# Patient Record
Sex: Male | Born: 1935 | Race: White | Hispanic: No | Marital: Married | Smoking: Former smoker
Health system: Southern US, Community
[De-identification: ages and names within clinical notes are randomized; demographics above are authoritative.]

## PROBLEM LIST (undated history)

## (undated) DIAGNOSIS — E8881 Metabolic syndrome: Secondary | ICD-10-CM

## (undated) DIAGNOSIS — I420 Dilated cardiomyopathy: Secondary | ICD-10-CM

## (undated) DIAGNOSIS — I5023 Acute on chronic systolic (congestive) heart failure: Secondary | ICD-10-CM

## (undated) DIAGNOSIS — J439 Emphysema, unspecified: Secondary | ICD-10-CM

## (undated) DIAGNOSIS — I739 Peripheral vascular disease, unspecified: Secondary | ICD-10-CM

## (undated) DIAGNOSIS — I255 Ischemic cardiomyopathy: Secondary | ICD-10-CM

## (undated) DIAGNOSIS — K219 Gastro-esophageal reflux disease without esophagitis: Secondary | ICD-10-CM

## (undated) DIAGNOSIS — I10 Essential (primary) hypertension: Secondary | ICD-10-CM

## (undated) DIAGNOSIS — E7800 Pure hypercholesterolemia, unspecified: Secondary | ICD-10-CM

## (undated) DIAGNOSIS — I219 Acute myocardial infarction, unspecified: Secondary | ICD-10-CM

## (undated) DIAGNOSIS — R0902 Hypoxemia: Secondary | ICD-10-CM

## (undated) DIAGNOSIS — G8929 Other chronic pain: Secondary | ICD-10-CM

## (undated) DIAGNOSIS — M549 Dorsalgia, unspecified: Secondary | ICD-10-CM

## (undated) DIAGNOSIS — I25810 Atherosclerosis of coronary artery bypass graft(s) without angina pectoris: Secondary | ICD-10-CM

## (undated) DIAGNOSIS — Z9189 Other specified personal risk factors, not elsewhere classified: Secondary | ICD-10-CM

## (undated) DIAGNOSIS — I2510 Atherosclerotic heart disease of native coronary artery without angina pectoris: Secondary | ICD-10-CM

## (undated) HISTORY — PX: HERNIA REPAIR: SHX51

## (undated) HISTORY — PX: CORONARY ARTERY BYPASS GRAFT: SHX141

## (undated) HISTORY — PX: CHOLECYSTECTOMY: SHX55

## (undated) HISTORY — PX: BACK SURGERY: SHX140

---

## 1999-10-08 ENCOUNTER — Inpatient Hospital Stay (HOSPITAL_COMMUNITY): Admission: EM | Admit: 1999-10-08 | Discharge: 1999-10-20 | Payer: Self-pay | Admitting: *Deleted

## 1999-10-09 ENCOUNTER — Encounter: Payer: Self-pay | Admitting: *Deleted

## 1999-10-13 ENCOUNTER — Encounter: Payer: Self-pay | Admitting: Cardiothoracic Surgery

## 1999-10-14 ENCOUNTER — Encounter: Payer: Self-pay | Admitting: Cardiothoracic Surgery

## 1999-10-15 ENCOUNTER — Encounter: Payer: Self-pay | Admitting: Cardiothoracic Surgery

## 1999-10-16 ENCOUNTER — Encounter: Payer: Self-pay | Admitting: Cardiothoracic Surgery

## 2001-10-22 ENCOUNTER — Emergency Department (HOSPITAL_COMMUNITY): Admission: EM | Admit: 2001-10-22 | Discharge: 2001-10-22 | Payer: Self-pay | Admitting: Internal Medicine

## 2002-04-30 ENCOUNTER — Emergency Department (HOSPITAL_COMMUNITY): Admission: EM | Admit: 2002-04-30 | Discharge: 2002-04-30 | Payer: Self-pay | Admitting: *Deleted

## 2002-05-03 ENCOUNTER — Inpatient Hospital Stay (HOSPITAL_COMMUNITY): Admission: AD | Admit: 2002-05-03 | Discharge: 2002-05-05 | Payer: Self-pay | Admitting: Urology

## 2002-05-03 ENCOUNTER — Encounter: Payer: Self-pay | Admitting: Urology

## 2002-05-04 ENCOUNTER — Encounter: Payer: Self-pay | Admitting: Urology

## 2002-07-04 ENCOUNTER — Ambulatory Visit (HOSPITAL_COMMUNITY): Admission: RE | Admit: 2002-07-04 | Discharge: 2002-07-04 | Payer: Self-pay | Admitting: Urology

## 2002-07-04 ENCOUNTER — Encounter: Payer: Self-pay | Admitting: Urology

## 2002-07-31 ENCOUNTER — Inpatient Hospital Stay (HOSPITAL_COMMUNITY): Admission: RE | Admit: 2002-07-31 | Discharge: 2002-08-02 | Payer: Self-pay | Admitting: Urology

## 2002-08-03 ENCOUNTER — Encounter (HOSPITAL_COMMUNITY): Admission: RE | Admit: 2002-08-03 | Discharge: 2002-09-02 | Payer: Self-pay | Admitting: Urology

## 2010-05-29 ENCOUNTER — Ambulatory Visit (HOSPITAL_COMMUNITY): Admission: RE | Admit: 2010-05-29 | Discharge: 2010-05-29 | Payer: Self-pay | Admitting: Cardiovascular Disease

## 2010-06-05 ENCOUNTER — Ambulatory Visit (HOSPITAL_COMMUNITY): Admission: RE | Admit: 2010-06-05 | Discharge: 2010-06-05 | Payer: Self-pay | Admitting: Cardiovascular Disease

## 2011-03-01 LAB — BASIC METABOLIC PANEL
BUN: 17 mg/dL (ref 6–23)
CO2: 28 mEq/L (ref 19–32)
Calcium: 9.8 mg/dL (ref 8.4–10.5)
GFR calc non Af Amer: >60 mL/min (ref >60)
Glucose, Bld: 135 mg/dL — ABNORMAL HIGH (ref 70–99)
Potassium: 4.7 mEq/L (ref 3.5–5.1)

## 2011-03-01 LAB — GLUCOSE, CAPILLARY: Glucose-Capillary: 139 mg/dL — ABNORMAL HIGH (ref 70–99)

## 2011-04-28 NOTE — Procedures (Signed)
NAME:  Taylor Goodman, Taylor Goodman              ACCOUNT NO.:  [id number]   MEDICAL RECORD NO.:  [id number]          PATIENT TYPE:  AMB   LOCATION:  SDS                          FACILITY:  [facility]   PHYSICIAN:  Nanetta Batty, M.D.   DATE OF BIRTH:  1932-08-08   DATE OF PROCEDURE:  DATE OF DISCHARGE:                    PERIPHERAL VASCULAR INVASIVE PROCEDURE   PROCEDURE:  Peripheral angiogram.   [name] is a 77 year old Caucasian male father of 5 referred for PV  evaluation.  He has a history of remote bypass grafting in October 2000.  His other problems include remote tobacco abuse, diabetes, hypertension,  hyperlipidemia.  He has severe claudication with Dopplers in our office  which show ABIs in the 0.4 range.  A 2-D echo revealed normal LV  function.  A Myoview showed no ischemia but with scar.  He presents now  for angiography and potential intervention.   DESCRIPTION OF PROCEDURE:  [facility] postabsorptive state.  He was  premedicated with p.o. Valium and some IV Versed and fentanyl.  His  right groin was prepped and shaved in the usual sterile fashion.  Xylocaine 1% was used for local anesthesia.  A 5-French sheath was  inserted into the right femoral artery using standard Seldinger  technique.  A 5-French pigtail catheter was used for abdominal  aortography with bifemoral runoff using a bolus-chase digital  subtraction step-table technique.  Visipaque dye was used for the  entirety of the case.  Retrograde aortic pressure was monitored during  the case.   ANGIOGRAPHIC RESULTS:  1. Abdominal aorta:      a.     Renal arteries - normal.      b.     Infrarenal abdominal aorta - mild atherosclerotic changes.  2. Left lower extremity:      a.     A 50% segmental left external iliac artery stenosis.      b.     Total left common femoral artery.  There was a large       collateral from the external iliac to the  profunda femoris.      c.     Total SFA, popliteal at tibioperoneal trunk.  3. Right lower extremity:      a.     A 50% segmental proximal right external iliac artery       stenosis  with 10-mm pullback gradient.      b.     A total right SFA, popliteal to tibioperoneal trunk with 3-       vessel runoff.   IMPRESSION:  [name] has severe infrainguinal disease not amenable  to revascularization.  Continued medical therapy will be recommended  with Pletal.   The sheath was removed and the pressure was held in the groin to achieve  hemostasis.  The patient left the lab in stable condition.  He will be  hydrated, discharged home in approximately 5 hours, and we will see him  back in the office in 1 week to 2 weeks for followup.      Nanetta Batty, M.D.  JB/MEDQ  D:  06/05/2010  T:  06/05/2010  Job:  [id number]   cc:   Second Floor PV Angiographic Suite  [name], M.D.  [facility]   Electronically Signed by Nanetta Batty M.D. on 06/24/2010 08:46:41 AM

## 2011-05-01 NOTE — Op Note (Signed)
[place]  Patient:    HAZEL WRINKLE                      MRN: [id number] Proc. Date: 10/13/99 Adm. Date:  [id number] Attending:  Mikey Bussing CC:         CVTS office             Veneda Melter, M.D. [facility]                           Operative Report  PREOPERATIVE DIAGNOSES: 1. Class IV post-infarction. 2. Unstable angina with severe three vessel disease. 3. Reduced left ventricular function with ejection fraction of 30%.  POSTOPERATIVE DIAGNOSES: 1. Class IV post-infarction. 2. Unstable angina with severe three vessel disease. 3. Reduced left ventricular function with ejection fraction of 30%.  OPERATION:  Coronary artery bypass grafting x 4 (left internal mammary artery to LAD, saphenous vein graft to first diagonal, saphenous vein graft to OM2, saphenous vein graft to posterior descending).  SURGEON:  Mikey Bussing, M.D.  ASSISTANT:  Areta Haber, P.A.  ANESTHESIOLOGIST:  Bedelia Person, M.D.  ANESTHESIA:  General.  INDICATION:  The patient is a 77 year old white male who developed unstable angina and ruled in for MI after undergoing emergency hip surgery for a fractured left  hip.  He had known coronary artery disease from prior cardiac catheterization by Dr. Arturo Morton. [place].  He has been evaluated for coronary artery bypass grafting in 1998 but felt that it was too high risk.  He had been treated medically and as relatively stable.  He underwent repeat cardiac catheterization by Dr. Veneda Melter after his hip surgery which revealed severe three vessel coronary disease and global dilatation with induced left ventricular function.  He was referred for coronary revascularization.  He did develop rest angina while in the hospital recovering  from his hip surgery.  Prior to the operation, the patient was examined in his hospital room and results of his last cardiac catheterization were discussed with the patient and  his wife. The indications and expected benefits of coronary artery bypass grafting were reviewed with the family and the patient.  I discussed the details of the operation including the placement of the surgical incisions, the choice of conduit, the use of cardiopulmonary bypass and the use of general anesthesia, and the expected postoperative recovery.  I reviewed the potential risks of the operation as well with the patient and wife including the risks of MI, CVA, bleeding, infection, nd death.  He understood the implications for surgery and agreed to proceed with the operation as planned under informed consent.  FINDINGS:  The heart was globally dilated with some inferior wall scarring. The coronaries were small but adequate coronaries for grafting.  The mammary was small but had adequate flow.  The saphenous vein was of good to above average quality.  DESCRIPTION OF PROCEDURE:  The patient was brought to the operating room and placed supine on the operating room table where general anesthesia was induced under invasive hemodynamic monitoring.  The chest, abdomen, and legs were prepped with Betadine and draped as a sterile field.  A median sternotomy was performed.  The saphenous vein was harvested from the right lower extremity.  The left internal mammary artery was harvested as a pedicle graft from its origin at the subclavian vessel and was a good vessel with excellent flow.  Heparin  was administered systemically, and when the ACT reached therapeutic level, the patient was cannulated.  Two purse-strings placed in the ascending aorta and right atrium and placed on cardiopulmonary bypass and cooled to 32 degrees.  The coronaries were dissected and the saphenous vein and the mammary artery were prepared for the distal anastomoses.  A cardioplegia cannula was placed and the  patient was then cooled to 28 degrees.  His aortic cross clamp was applied. Then 750 cc of  cold blood cardioplegia was delivered to the aortic root with immediate cardioplegic arrest and septal temperature dropping less than 12 degrees. Topical ice saline slush was used to augment myocardial preservation and a pericardial insulator pad used to protect the left phrenic nerve.  The distal coronary anastomoses were then performed.  The first distal anastomosis was to the posterior descending.  This was 1.5 mm vessel, proximal 80% stenosis.  A reverse saphenous vein was sewn end-to-side with a running 7-0 Prolene and there was good flow through the graft.  The second distal anastomosis was to the first diagonal.  This was a 1.2 mm vessel, proximal 80% stenosis.  A reverse saphenous vein was sewn end-to-side with a running 7-0 Prolene and there was good flow through the graft.  The third distal anastomosis was to the OM2 on the posterior lateral wall of the left ventricle.  This was a 1.5 mm vessel with proximal 90% stenosis.  A reverse saphenous vein was sewn end-to-side with a running 7-0 Prolene and there was excellent flow through the graft.  Cardioplegia was redosed through the vein grafts.  The fourth distal anastomosis was to the mid-LAD just distal to the second diagonal.  This was a 1.5 mm vessel, proximal 90% stenosis.  The left internal mammary pedicle was brought through an opening created in the left lateral pericardium and was brought down on the LAD and sewn end-to-side with a running 8-0 Prolene.  There was excellent flow through the anastomosis with immediate rise n septal temperature after release of the pedicle clamp on the mammary artery. The mammary pedicle was secured to the epicardium and the aortic cross clamp was removed.  The heart was cardioverted back to a regular rhythm.  A partial occluding clamp was placed on the ascending aorta which had some proximal disease.  The clamp was placed to avoid the plaque.  Three proximal vein anastomoses  were performed using a 4.0 mm flexion running 6-0 Prolene.  The partial occluding clamp was removed and  the vein grafts were perfused.  Each had excellent flow.  The patient was rewarmed and reperfused.  When he reached 37 degrees, and after  temporary pacing wires had been applied, the lungs were expanded, and the patient was weaned from cardiopulmonary bypass without difficulty.  Protamine was administered and the cannulae were removed.  Hemostasis was adequate and the mediastinum was irrigated with warm antibiotic irrigation.  The leg incision was irrigated with antibiotic irrigation and closed in a standard fashion.  Pericardium was approximated superiorly over the aorta and vein grafts. Two mediastinum and a left pleural chest tube were placed and brought out through separate incisions.  The sternum was reapproximated with interrupted steel wire and the pectoralis fascia and subcutaneous layers were closed with running Vicryl. The skin was closed with a subcuticular and sterile dressings were applied.  Total cardiopulmonary bypass time was 130 minutes with aorta cross clamp time of 60 minutes. DD:  10/13/99 TD:  10/13/99 Job: [id number]

## 2011-05-01 NOTE — Discharge Summary (Signed)
NAME:  Taylor Goodman, Taylor Goodman                        ACCOUNT NO.:  [id number]   MEDICAL RECORD NO.:  [id number]                   PATIENT TYPE:  INP   LOCATION:  A206                                 FACILITY:  [facility]   PHYSICIAN:  Dennie Maizes, M.D.                DATE OF BIRTH:  02-13-1932   DATE OF ADMISSION:  07/31/2002  DATE OF DISCHARGE:  08/02/2002                                 DISCHARGE SUMMARY   FINAL DIAGNOSES:  1. Benign prostatic hypertrophy with bladder neck obstruction.  2. Recurrent urinary tract infections.  3. Hematuria.  4. Hypertension.  5. Coronary artery disease.  6. Peripheral vascular disease.   OPERATIVE PROCEDURE:  Transurethral resection of prostate done on July 31, 2002.   COMPLICATIONS:  None.   SUMMARY:  This 77 year old male was evaluated for intermittent gross  hematuria in May 2003.  He was evaluated with a renal ultrasound and CT of  the abdomen and pelvis.  This revealed atrophic left kidney with mild  hydronephrosis.  Cystoscopy was done under anesthesia.  This revealed  hemorrhagic cystitis.  He also had BPH with bladder neck obstruction.  Biopsy of the bladder revealed cystitis.  No evidence of malignancy was  noted.   The patient has been seen on several occasions in the office with recurrent  urinary tract infections.  Pseudomonas aeruginosa had been grown in culture.  The patient has been treated with several courses of antibiotics.  He still  has significant urinary frequency and dysuria associated with voiding  difficulty.  He was brought to the day hospital for cystoscopy and multiple  bladder biopsies and transurethral resection of the prostate.   PAST MEDICAL HISTORY:  Positive for hypertension, coronary artery disease,  peripheral vascular disease, urolithiasis, recurrent urinary tract  infections, status post coronary artery bypass grafting three years ago,  status post left femoral-popliteal bypass surgery, status post surgery  of  both knees, surgery for herniated disk, status post bilateral inguinal  hernia repair, status post open reduction and internal fixation of a left  hip fracture, status post cholecystectomy.   MEDICATIONS:  1. Lipitor 40 mg 1 p.o. q.d.  2. Lorcet Plus 1 p.o. q.h.s. p.r.n. pain.  3. Lopressor 50 mg 1 p.o. b.i.d.  4. Lanoxin 0.125 mg p.o. q.d.  5. Lotensin 10 mg 1 p.o. q.d.  6. Flomax 0.4 mg p.o. q.d.   ALLERGIES:  None.   PHYSICAL EXAMINATION:  HEENT:  Normal.  NECK:  No masses.  LUNGS:  Clear to auscultation.  HEART:  Regular rate and rhythm.  No murmurs.  ABDOMEN:  Soft.  No palpable flank mass.  No costovertebral angle  tenderness.  Bladder not palpable.  GENITALIA:  Penis and testes are normal.  RECTAL:  A 30-g size benign prostate.   LABORATORY DATA:  CBC:  WBC 6.2, hemoglobin 13.7, hematocrit 40.9, BUN 13,  creatinine 1.0.  Urine culture revealed growth of  more than 100,000 colonies  of Pseudomonas aeruginosa.   HOSPITAL COURSE:  The patient was taken to the OR on July 31, 2002.  Under  spinal anesthesia, transurethral resection of the prostate was done.  The  patient did well in the postoperative period.  He was seen by Dr. Felecia Shelling for  management of his medical problems.  On the first postoperative day, he was  found to be stable with clear urine.  The bladder irrigation was  discontinued.  On the second postoperative day, the urine was clear and the  Foley catheter was removed.  The patient was voiding well after the Foley  catheter removal.  He was treated for a urinary tract infection with IV  gentamycin and Rocephin.  The IV Rocephin will be continued on an outpatient  basis for five more days.  The patient was given Percocet 5/325 one p.o.  q.8h. p.r.n. pain #15.  He will be reviewed in the office in two weeks.  The  final pathology of the prostate revealed benign prostatic hypertrophy, as  well as chronic prostatitis.  There was no evidence of malignancy.   The  patient was given postoperative instructions and he was advised to call me  for any fever, chills, voiding difficulty, or gross hematuria.   FOLLOW UP:  He will be reviewed in the office in two weeks for followup.                                                 Dennie Maizes, M.D.    SK/MEDQ  D:  09/21/2002  T:  09/22/2002  Job:  [id number]   cc:   Tesfaye D. Felecia Shelling, M.D.  [address]  [place]  [place] [address]  Fax: [phone]

## 2011-05-01 NOTE — H&P (Signed)
NAME:  Taylor Goodman, Taylor Goodman                        ACCOUNT NO.:  [id number]   MEDICAL RECORD NO.:  [id number]                   PATIENT TYPE:  AMB   LOCATION:  DAY                                  FACILITY:  [facility]   PHYSICIAN:  Dennie Maizes, M.D.                DATE OF BIRTH:  1932-09-14   DATE OF ADMISSION:  07/31/2002  DATE OF DISCHARGE:                                HISTORY & PHYSICAL   CHIEF COMPLAINT:  Recurrent urinary tract infections, urinary frequency,  dysuria.   HISTORY OF PRESENT ILLNESS:  This 77 year old male was evaluated for  intermittent gross hematuria in May 2003.  He was evaluated with a renal  ultrasound and CT of the abdomen and pelvis.  This revealed atrophic left  kidney with mild hydronephrosis.  Cystoscopy was done under anesthesia.  This revealed hemorrhagic cystitis.  He also had BPH with bladder neck  obstruction.  Biopsy of the bladder revealed cystitis.  No evidence of  malignancy was noted.   The patient has been seen on several occasions in the office with recurrent  urinary tract infections.  Pseudomonas aeruginosa has been grown in culture.  The patient has been treated with several courses of antibiotics.  He still  has some significant urinary frequency and dysuria with associated voiding  difficulty.  He is brought to the day hospital today for cystoscopy,  multiple bladder biopsies, and transurethral resection of the prostate.   PAST MEDICAL HISTORY:  Positive for hypertension, coronary artery disease,  peripheral vascular disease, urolithiasis, recurrent urinary tract  infections.  Status post coronary artery bypass grafting three years ago,  status post left femoral-popliteal bypass surgery.  Status post surgery on  both knees.  Surgery for herniated disk.  Status post bilateral inguinal  hernia repair.  Status post open reduction and internal fixation of left hip  fracture.  Status post cholecystectomy.   MEDICATIONS:  Lipitor 40 mg one  p.o. q.d., Lorcet Plus one p.o. q.h.s.  p.r.n. pain, Lopressor 50 mg one p.o. b.i.d., Lanoxin 0.125 mg p.o. q.d.,  Lotensin 10 mg one p.o. q.d., Flomax 0.4 mg p.o. q.d.   ALLERGIES:  None.   PHYSICAL EXAMINATION:  HEENT:  Normal.  NECK:  No masses.  CHEST:  Lungs clear to auscultation.  CARDIAC:  Regular rate and rhythm, no murmurs.  ABDOMEN:  Soft, no palpable flank mass, no costovertebral angle tenderness.  Bladder not palpable.  GENITOURINARY:  Penis and testes are normal.  RECTAL:  A 30 g size benign prostate.   LABORATORY DATA:  The patient has been evaluated with a CT scan of the  abdomen and pelvis.  This revealed normal right kidney without mass or  lesion.  Atrophic left kidney with mild left hydronephrosis is noted.  There  is also bladder wall thickening.   IMPRESSION:  Recurrent urinary tract infections, history of hematuria,  benign prostatic hypertrophy, and bladder neck obstruction.  PLAN:  Cystoscopy, multiple bladder biopsies, transurethral resection of the  prostate under anesthesia in the hospital.  I have discussed with the  patient regarding the diagnosis, operative details, outcome, possible risks  and complications, and he has agreed for the procedure to be done.  He will  be admitted to the hospital in the postoperative period.                                               Dennie Maizes, M.D.    SK/MEDQ  D:  07/30/2002  T:  07/30/2002  Job:  [id number]   cc:   Tesfaye D. Felecia Shelling, M.D.

## 2011-05-01 NOTE — Op Note (Signed)
NAME:  Taylor Goodman, Taylor Goodman                        ACCOUNT NO.:  [id number]   MEDICAL RECORD NO.:  [id number]                   PATIENT TYPE:  INP   LOCATION:  A206                                 FACILITY:  [facility]   PHYSICIAN:  Dennie Maizes, M.D.                DATE OF BIRTH:  06/11/1932   DATE OF PROCEDURE:  07/31/2002  DATE OF DISCHARGE:  08/02/2002                                 OPERATIVE REPORT   PREOPERATIVE DIAGNOSIS:  Benign prostatic hypertrophy with bladder neck  obstruction, recurrent urinary tract infections.   POSTOPERATIVE DIAGNOSIS:  Benign prostatic hypertrophy with bladder neck  obstruction, recurrent urinary tract infections.   PROCEDURE:  Transurethral resection of the prostate.   SURGEON:  Dennie Maizes, M.D.   ANESTHESIA:  Spinal.   COMPLICATIONS:  None.   ESTIMATED BLOOD LOSS:  100 mL   DRAINS:  22 French triple lumen Foley catheter with 30 cc balloon   INDICATIONS FOR PROCEDURE:  This 77 year old male was evaluated for  recurrent urinary tract infections.  He had BPH with bladder neck  obstruction.  He was taken to the OR today for a transurethral resection of  the prostate.   DESCRIPTION OF PROCEDURE:  Spinal anesthesia was induced and the patient was  placed on the OR table in the dorsal lithotomy position.  The lower abdomen  and genitalia were prepped and draped in a sterile fashion.  Cystoscopy was  down with the 20 French cystoscope.  The urethra was normal.  There was  moderate hypertrophy involving both lateral lobes of the prostate with  anatomical obstruction of the bladder neck area.  The bladder was then found  to be trabeculated. There was diffuse hyperkalemia of the bladder mucosa  secondary to the infection.  There was no evidence of any foreign body,  tumor, or stones inside the bladder.  The cystoscope was then removed.   The urethra was dilated up to 30 [place] with [employer] sounds  A 28 [place] resectoscope with  continuous bladder irrigation was then inserted  into the bladder.  Resection was started at 6 o'clock position.  The  obstructing adenoma within the bladder neck and verumontanum were resected  first.  The right and left lateral lobes were then resected up to the level  of the capsule.  The patient had multiple small prostatic calculi near the  capsule which were released.  Finally resection was done in the anterior  midline area.  The prostate chips were removed and sent for  histopathological examination.  The prostatic fossa was then closely  examined and complete hemostasis was  obtained with cauterization.  The instruments were removed.  A 22 French  triple lumen Foley catheter with 30 cc balloon was inserted into the  bladder.  Continuous bladder irrigation was started and the returns were  clear.  The patient was transferred to the PACU in a satisfactory condition.  Dennie Maizes, M.D.    SK/MEDQ  D:  07/31/2002  T:  08/03/2002  Job:  [id number]   cc:   Tesfaye D. Felecia Shelling, M.D.

## 2011-05-01 NOTE — H&P (Signed)
[facility]  Patient:    Taylor Goodman, Taylor Goodman Visit Number: [id number] MRN: [id number]          Service Type: MED Location: 3A [id number] Attending Physician:  Dennie Maizes Dictated by:   Dennie Maizes, M.D. Admit Date:  05/03/2002   CC:         Avon Gully, M.D.   History and Physical  CHIEF COMPLAINT:  Intermittent gross hematuria, passing blood clots in the urine.  HISTORY OF PRESENT ILLNESS:  This 77 year old male came to the office today complaining of intermittent gross hematuria for about three weeks.  He was seen by Dr. Felecia Shelling and treated with a course of Cipro.  The patient continued to have hematuria.  He went to the emergency room.  He was treated with a course of Bactrim.  He has persistent significant gross hematuria at present. He is passing blood clots in the urine.  He has some difficulty with voiding due to the blood clots.  He did not have any fevers, flank pain, or dysuria. He does not have troublesome urinary frequency or nocturia.  He has been treated in the past for chronic prostatitis and BPH.  He also has a history of renal calculi.  PAST MEDICAL HISTORY:  Positive for hypertension, coronary artery disease, peripheral vascular disease, urolithiasis.  PAST SURGICAL HISTORY:  Status post coronary artery bypass grafting three years ago, status post left femoral-popliteal bypass surgery, surgery on both knees, surgery for herniated disk, status post bilateral inguinal hernia repair, status post open reduction and internal fixation of left hip fracture, status post cholecystectomy.  MEDICATIONS: 1. Lipitor 40 mg 1 p.o. q.d. 2. Lorcet Plus 1 p.o. q.8h. p.r.n. pain. 3. Bactrim DS p.o. b.i.d. 4. Lopressor 50 mg 1 p.o. b.i.d. 5. Lanoxin 0.125 mg p.o. q.d. 6. Lotensin 10 mg 1 p.o. q.d. 7. Flomax 0.4 mg p.o. q.d.  ALLERGIES:  None.  PHYSICAL EXAMINATION:  VITAL SIGNS:  Height 5 feet 7 inches, weight 180 pounds.  HEENT:   Normal.  NECK:  No masses.  LUNGS:  Clear to auscultation.  HEART:  Regular rate and rhythm, no murmurs.  ABDOMEN:  Soft.  No palpable flank mass.  No costovertebral angle tenderness.  GU:  Penis and testes are normal.  RECTAL:  Examination showed 30 g size benign prostate.  ADMISSION LABORATORY DATA:  WBC 7.3, hemoglobin 11.3, hematocrit 33.4.  PT 12.2, PTT 25.  BUN 17, creatinine 1.3, electrolytes within normal range. Urinalysis revealed large blood, nitrites positive, leukocytes large.  IMPRESSION: 1. Hematuria. 2. Clot retention. 3. Benign prostatic hypertrophy with bladder neck obstruction. 4. Possible urinary tract infection.  PLAN: 1. Will admit the patient to the hospital. 2. IV fluids. 3. Urine culture and sensitivity.  Will continue the Bactrim. 4. IVP tomograms and renal ultrasound for further evaluation of hematuria. 5. Discussed with the patient regarding further evaluation.  He is    scheduled to undergo cystoscopy, multiple bladder biopsies, transurethral    resection of bladder tumor, possible transurethral resection of prostate    under anesthesia in the a.m.  I have explained to him regarding diagnosis,    operative details, outcome, possible risks and complications, and he has    agreed for the procedure to be done. Dictated by:   Dennie Maizes, M.D. Attending Physician:  Dennie Maizes DD:  05/03/02 TD:  05/03/02 Job: [id number]

## 2011-05-01 NOTE — Op Note (Signed)
[facility]  Patient:    Taylor Goodman, Taylor Goodman Visit Number: [id number] MRN: [id number]          Service Type: MED Location: 3A N027 01 Attending Physician:  Dennie Maizes Dictated by:   Dennie Maizes, M.D. Proc. Date: 05/04/02 Admit Date:  05/03/2002 Discharge Date: 05/05/2002   CC:         Avon Gully, M.D.   Operative Report  PREOPERATIVE DIAGNOSES:  Hematuria, clot retention, possible bladder tumor.  POSTOPERATIVE DIAGNOSES:  Hematuria, clot retention, hemorrhagic cystitis, bleeding from prostate.  ANESTHESIA:  Spinal.  SURGEON:  Dennie Maizes, M.D.  PROCEDURE:  Cystoscopy, evacuation of blood clots, multiple bladder biopsies and fulguration of the prostate.  COMPLICATIONS:  None.  ESTIMATED BLOOD LOSS:  Minimal.  DRAINS:  An 5 French Foley catheter in the bladder.  INDICATIONS FOR PROCEDURE:  This 77 year old male was admitted to the hospital with intermittent gross hematuria for about three weeks. He was evaluated with an IVP tomogram/renal ultrasound. Both these x-rays revealed atrophic left kidney with moderate left hydronephrosis and dilated ureter up to the distal ureter. The right kidney and collecting system were normal. Large intraluminal bladder masses were noted probably representing bladder tumor or blood clots. The patient was taken to the OR today for cystoscopy, multiple bladder biopsies, possible TURP. I plan to evaluate the left collecting system with retrograde pyelogram.  DESCRIPTION OF PROCEDURE:  Spinal anesthesia was induced and the patient was placed on the OR table in the dorsal lithotomy position. The lower abdomen and genitalia were prepped and draped in a sterile fashion. Cystoscopy was done with a 25 [place] scope. The patient had a large amount of blood stained urine in the bladder. Large blood clots were noted inside the bladder. I tried to remove the blood clots with [place] syringe irrigation, the  blood clots were too large to be removed through the cystoscope. The cystoscope was removed.  The urethra was dilated up to 30 [place] with [employer]. A 28 French resectoscope with continuous bladder irrigation was then inserted into the bladder. With an [facility] evacuator, irrigation of the bladder was done and all the blood clots were removed.  The cystoscope was then reintroduced and the bladder was examined. The bladder was found to be heavily trabeculated with hyperemia of the bladder mucosa. This was suggestive of hemorrhagic cystitis. There was no evidence of any bladder tumor or foreign body in the bladder. The ureteral orifices could not be seen clearly and hence a left retrograde pyelogram could not be done. The radiology report suggested a stone inside the bladder diverticulum. I was unable to see any diverticulum especially on the right side. This needs further evaluation with a CT scan.  With the rigid biopsy forceps, mucosal biopsies are done from the right lateral wall, posterior wall and left lateral wall of the bladder. An electrode was introduced into the bladder and the biopsy sites were fulgurated. Examination of the prostate revealed moderate hypertrophy of both lateral lobes of the prostate with partial obstruction of the bladder neck area. There was bleeding from the prostatic mucosa at the 6 oclock position. This was fulgurated and the bleeding was controlled. There was no active bleeding at this time. The instruments are removed. An 29 French Foley catheter was inserted into the bladder and connected to the bag. The patient was transferred to the PACU in a satisfactory condition.  The cystoscopy today is unsatisfactory due to the blood clots and cloudy urine. I plan to  reevaluate the patient in a few weeks with repeat cystoscopy. He may need TURP or holmium laser oblation of the prostate at the time. Will try to do a retrograde pyelogram at that time to  evaluate the left kidney and collecting system. Dictated by:   Dennie Maizes, M.D. Attending Physician:  Dennie Maizes DD:  05/04/02 TD:  05/08/02 Job: [id number]

## 2011-05-01 NOTE — Discharge Summary (Signed)
NAME:  Taylor Goodman, Taylor Goodman                        ACCOUNT NO.:  [id number]   MEDICAL RECORD NO.:  [id number]                   PATIENT TYPE:  OUT   LOCATION:  RAD                                  FACILITY:  [facility]   PHYSICIAN:  Dennie Maizes, M.D.                DATE OF BIRTH:  [birth year]   DATE OF ADMISSION:  05/03/2002  DATE OF DISCHARGE:  05/05/2002                                 DISCHARGE SUMMARY   FINAL DIAGNOSES:  1. Benign prostatic hypertrophy with bladder neck obstruction.  2. Hematuria.  3. Hypertension.  4. Hemorrhagic cystitis.  5. Atrophic left kidney with hydronephrosis.  6. Coronary artery disease.  7. Peripheral vascular disease.  8. Urolithiasis.   PROCEDURES:  1. Cystoscopy.  2. Evacuation of blood clots.  3. Multiple bladder biopsies  and fulguration of the prostate done on     05/04/2002, complications none.   HISTORY:  This 77 year old male came to the office complaining of  intermittent gross hematuria for about three weeks.  He was seen by Dr.  Felecia Shelling and started on a course of  Cipro.  The patient continued to have  hematuria.  He went to the emergency room.  He was started on a course of  Bactrim.  He had persistent significant gross hematuria, and he was passing  blood clots in the urine.  He also had some difficulty with voiding due to  the blood clots. He denied having any fever, flank pain, or dysuria.  He  denied having any troublesome urinary frequency or nocturia.  He had been  treated in the past for chronic prostatitis and BPH.  He also has renal  calculi.   PAST MEDICAL HISTORY:  Positive for hypertension, coronary artery disease,  peripheral vascular disease, and urolithiasis.   PAST SURGICAL HISTORY:  Status post coronary artery bypass grafting about  three years ago.  He is status post left femoral-popliteal bypass surgery,  surgery on both knees, surgery for herniated disk, status post bilateral  inguinal hernia repair, status post open  reduction, internal fixation of  left limb fracture,and status post cholecystectomy.   MEDICATIONS:  1. Lipitor 40 mg 1 p.o. q.d.  2. Lorcet Plus 1 p.o. q.8h. p.r.n. pain.  3. Bactrim p.o. b.i.d.  4. Lopressor 50 mg p.o. b.i.d.  5. Lanoxin 0.125 mg p.o. q.d.  6. Lotensin 10 mg 1 p.o. q.d.  7. Flomax 0.4 mg 1 p.o. q.d.   ALLERGIES:  None.   PHYSICAL EXAMINATION:  VITAL SIGNS:  Height 5 feet 7 inches, weight 180  pounds.  HEENT:  Normal.  NECK:  No masses.  LUNGS:  Clear to auscultation.  HEART:  Regular rate and rhythm, no murmur.  ABDOMEN:  Soft, no palpable flank mass, no costovertebral angle tenderness.  GU:  Penis and testes are normal.  RECTAL:  A 30 mg size benign prostate.   ADMISSION LABORATORY DATA:  WBC 7.3, hemoglobin  11.3, hematocrit 33.4.  PT  12.2, PTT 25.  BUN 17, creatinine 1.3.  Electrolytes were within normal  range.  Urinalysis revealed large blood, nitrite positive, leukocyte  esterase large.   COURSE IN THE HOSPITAL:  The patient was admitted to the hospital and  started on IV fluids.  Urine culture and sensitivity was done, and Bactrim  was continued.  IVP tomograms and renal ultrasound were done. These revealed  atrophic left kidney with dilated collecting system up to the ureteropelvic  and uterosacral junction.  There was no evidence of any filling defect or  stones in the collecting system on the left side.   After counseling, the patient was taken to the OR for cystoscopy and  multiple bladder biopsies with possible TURBT after transurethral resection  of the prostate.  On 05/04/2002, cystoscopy and evacuation of blood clots  were done.  The patient had hemorrhagic cystitis and bleeding from the  prostate.  After multiple bladder biopsies, fulguration of the prostate was  done.  The patient did well in the postoperative period.  He was seen by Dr.  Felecia Shelling for evaluation and management of his medical problems.   On the first postoperative day, the  patient was found to be doing well  without any complaints.  His abdomen was soft, and he was voiding well.  His  hemoglobin was 9.4, and hematocrit was 27.3.  I discussed with the patient  regarding transfusion, and he declined transfusion.  He was started on oral  iron supplements.   The patient was discharged and sent home on 07/05/2002.  He was continued on  Bactrim p.o. b.i.d. and was started on Feosol 1 p.o. q.d. for 2 months for  correction of anemia.  He was advised to call me for any fever, chills,  severe pain or bleeding.  He will return to the office for followup in one  week.   The bladder biopsy revealed a chronic active cystitis and follicular  prostatitis. There was no evidence of malignancy.                                                Dennie Maizes, M.D.    SK/MEDQ  D:  07/06/2002  T:  07/15/2002  Job:  [id number]   cc:   Tesfaye D. Felecia Shelling, M.D.
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
## 2011-10-15 ENCOUNTER — Telehealth: Payer: Self-pay

## 2011-10-15 NOTE — Telephone Encounter (Signed)
Called and spoke to pt's wife. She said they always see Dr. Karilyn Cota. I faxed the referral note back to [place] at [facility] to forward to Dr. Karilyn Cota.

## 2012-01-14 ENCOUNTER — Encounter (INDEPENDENT_AMBULATORY_CARE_PROVIDER_SITE_OTHER): Payer: Self-pay | Admitting: *Deleted

## 2012-01-19 ENCOUNTER — Telehealth (INDEPENDENT_AMBULATORY_CARE_PROVIDER_SITE_OTHER): Payer: Self-pay | Admitting: *Deleted

## 2012-01-19 NOTE — Telephone Encounter (Signed)
Taylor Goodman called and would like to see if Dr. Karilyn Cota would please seen her husband. Taylor Goodman is a long time patient of Dr. Patty Sermons. Taylor Goodman, her husband, was referred to [facility] for a tcs.

## 2012-01-21 NOTE — Telephone Encounter (Signed)
Left message on answering machine asking [name] to call so I can get her husband's info and sch'd TCS

## 2012-02-03 ENCOUNTER — Other Ambulatory Visit (INDEPENDENT_AMBULATORY_CARE_PROVIDER_SITE_OTHER): Payer: Self-pay | Admitting: *Deleted

## 2012-02-03 ENCOUNTER — Telehealth (INDEPENDENT_AMBULATORY_CARE_PROVIDER_SITE_OTHER): Payer: Self-pay | Admitting: *Deleted

## 2012-02-03 DIAGNOSIS — Z800 Family history of malignant neoplasm of digestive organs: Secondary | ICD-10-CM

## 2012-02-03 DIAGNOSIS — Z1211 Encounter for screening for malignant neoplasm of colon: Secondary | ICD-10-CM

## 2012-02-03 MED ORDER — PEG-KCL-NACL-NASULF-NA ASC-C 100 G PO SOLR: 1.0000 | Freq: Once | ORAL | Status: DC

## 2012-02-03 NOTE — Telephone Encounter (Signed)
Patient needs movi prep 

## 2012-03-01 ENCOUNTER — Telehealth (INDEPENDENT_AMBULATORY_CARE_PROVIDER_SITE_OTHER): Payer: Self-pay | Admitting: *Deleted

## 2012-03-01 NOTE — Telephone Encounter (Signed)
Requesting MD/PCP:  [facility]     Name & DOB: Taylor Goodman 10/17/1932     Procedure: tcs  Reason/Indication:  Screening, fam hx colon ca  Has patient had this procedure before?  no  If so, when, by whom and where?    Is there a family history of colon cancer?  yes  Who?  What age when diagnosed?  brother  Is patient diabetic?   no      Does patient have prosthetic heart valve?  no  Do you have a pacemaker?  no  Has patient had joint replacement within last 12 months?  no  Is patient on Coumadin, Plavix and/or Aspirin? yes  Medications: asa 81 mg daily, omeprazole 40 mg daily, lisinopril/HCTZ 10/12.5 mg daily, simvastatin 40 mg daily, hydrocodone 10/650 mg daily, [place]  Allergies: nkda   Pharmacy: [facility]  Medication Adjustment: asa 2 days  Procedure date & time: 03/31/12 at 1030

## 2012-03-03 NOTE — Telephone Encounter (Signed)
agree

## 2012-03-30 ENCOUNTER — Encounter (HOSPITAL_COMMUNITY): Payer: Self-pay | Admitting: Pharmacy Technician

## 2012-03-30 MED ORDER — SODIUM CHLORIDE 0.45 % IV SOLN
Freq: Once | INTRAVENOUS | Status: AC
  Administered 2012-03-31: 10:00:00 via INTRAVENOUS

## 2012-03-31 ENCOUNTER — Encounter (HOSPITAL_COMMUNITY): Payer: Self-pay | Admitting: *Deleted

## 2012-03-31 ENCOUNTER — Encounter (HOSPITAL_COMMUNITY)
Admission: RE | Disposition: A | Discharge status: Home Health | Payer: Self-pay | Source: Ambulatory Visit | Attending: Internal Medicine

## 2012-03-31 ENCOUNTER — Ambulatory Visit (HOSPITAL_COMMUNITY)
Admission: RE | Admit: 2012-03-31 | Discharge: 2012-03-31 | Disposition: A | Discharge status: Home Health | Payer: Medicare Other | Source: Ambulatory Visit | Attending: Internal Medicine | Admitting: Internal Medicine

## 2012-03-31 DIAGNOSIS — Z79899 Other long term (current) drug therapy: Secondary | ICD-10-CM | POA: Insufficient documentation

## 2012-03-31 DIAGNOSIS — Z800 Family history of malignant neoplasm of digestive organs: Secondary | ICD-10-CM

## 2012-03-31 DIAGNOSIS — D126 Benign neoplasm of colon, unspecified: Secondary | ICD-10-CM | POA: Insufficient documentation

## 2012-03-31 DIAGNOSIS — Z1211 Encounter for screening for malignant neoplasm of colon: Secondary | ICD-10-CM

## 2012-03-31 DIAGNOSIS — K644 Residual hemorrhoidal skin tags: Secondary | ICD-10-CM | POA: Insufficient documentation

## 2012-03-31 DIAGNOSIS — K5730 Diverticulosis of large intestine without perforation or abscess without bleeding: Secondary | ICD-10-CM | POA: Insufficient documentation

## 2012-03-31 DIAGNOSIS — I10 Essential (primary) hypertension: Secondary | ICD-10-CM | POA: Insufficient documentation

## 2012-03-31 DIAGNOSIS — E7800 Pure hypercholesterolemia, unspecified: Secondary | ICD-10-CM | POA: Insufficient documentation

## 2012-03-31 HISTORY — DX: Pure hypercholesterolemia, unspecified: E78.00

## 2012-03-31 HISTORY — DX: Essential (primary) hypertension: I10

## 2012-03-31 HISTORY — DX: Dorsalgia, unspecified: M54.9

## 2012-03-31 HISTORY — DX: Atherosclerotic heart disease of native coronary artery without angina pectoris: I25.10

## 2012-03-31 HISTORY — PX: COLONOSCOPY: SHX5424

## 2012-03-31 HISTORY — DX: Peripheral vascular disease, unspecified: I73.9

## 2012-03-31 HISTORY — DX: Acute myocardial infarction, unspecified: I21.9

## 2012-03-31 HISTORY — DX: Other chronic pain: G89.29

## 2012-03-31 HISTORY — DX: Gastro-esophageal reflux disease without esophagitis: K21.9

## 2012-03-31 SURGERY — COLONOSCOPY
Anesthesia: Moderate Sedation

## 2012-03-31 MED ORDER — MEPERIDINE HCL 50 MG/ML IJ SOLN
INTRAMUSCULAR | Status: AC
  Filled 2012-03-31: qty 1

## 2012-03-31 MED ORDER — MEPERIDINE HCL 50 MG/ML IJ SOLN
INTRAMUSCULAR | Status: DC | PRN
  Administered 2012-03-31 (×2): 25 mg via INTRAVENOUS

## 2012-03-31 MED ORDER — MIDAZOLAM HCL 5 MG/5ML IJ SOLN
INTRAMUSCULAR | Status: AC
  Filled 2012-03-31: qty 10

## 2012-03-31 MED ORDER — MIDAZOLAM HCL 5 MG/5ML IJ SOLN
INTRAMUSCULAR | Status: DC | PRN
  Administered 2012-03-31 (×2): 2 mg via INTRAVENOUS

## 2012-03-31 MED ORDER — SIMETHICONE 40 MG/0.6ML PO SUSP
ORAL | Status: DC | PRN
  Administered 2012-03-31: 11:00:00

## 2012-03-31 NOTE — Discharge Instructions (Signed)
No aspirin for one week. Resume other medications as before. High fiber diet. No driving for 24 hours. Physician will contact you with the biopsy results.  Colonoscopy Care After Read the instructions outlined below and refer to this sheet in the next few weeks. These discharge instructions provide you with general information on caring for yourself after you leave the hospital. Your doctor may also give you specific instructions. While your treatment has been planned according to the most current medical practices available, unavoidable complications occasionally occur. If you have any problems or questions after discharge, call your doctor. HOME CARE INSTRUCTIONS ACTIVITY:  You may resume your regular activity, but move at a slower pace for the next 24 hours.   Take frequent rest periods for the next 24 hours.   Walking will help get rid of the air and reduce the bloated feeling in your belly (abdomen).   No driving for 24 hours (because of the medicine (anesthesia) used during the test).   You may shower.   Do not sign any important legal documents or operate any machinery for 24 hours (because of the anesthesia used during the test).  NUTRITION:  Drink plenty of fluids.   You may resume your normal diet as instructed by your doctor.   Begin with a light meal and progress to your normal diet. Heavy or fried foods are harder to digest and may make you feel sick to your stomach (nauseated).   Avoid alcoholic beverages for 24 hours or as instructed.  MEDICATIONS:  You may resume your normal medications unless your doctor tells you otherwise.  WHAT TO EXPECT TODAY:  Some feelings of bloating in the abdomen.   Passage of more gas than usual.   Spotting of blood in your stool or on the toilet paper.  IF YOU HAD POLYPS REMOVED DURING THE COLONOSCOPY:  No aspirin products for 7 days or as instructed.   No alcohol for 7 days or as instructed.   Eat a soft diet for the next  24 hours.  FINDING OUT THE RESULTS OF YOUR TEST Not all test results are available during your visit. If your test results are not back during the visit, make an appointment with your caregiver to find out the results. Do not assume everything is normal if you have not heard from your caregiver or the medical facility. It is important for you to follow up on all of your test results.  SEEK IMMEDIATE MEDICAL CARE IF:  You have more than a spotting of blood in your stool.   Your belly is swollen (abdominal distention).   You are nauseated or vomiting.   You have a fever.   You have abdominal pain or discomfort that is severe or gets worse throughout the day.  Document Released: 07/14/2004 Document Revised: 11/19/2011 Document Reviewed: 07/12/2008 [facility] Patient Information 2012 [place], [place].  Colon Polyps A polyp is extra tissue that grows inside your body. Colon polyps grow in the large intestine. The large intestine, also called the colon, is part of your digestive system. It is a long, hollow tube at the end of your digestive tract where your body makes and stores stool. Most polyps are not dangerous. They are benign. This means they are not cancerous. But over time, some types of polyps can turn into cancer. Polyps that are smaller than a pea are usually not harmful. But larger polyps could someday become or may already be cancerous. To be safe, doctors remove all polyps and test  them.  WHO GETS POLYPS? Anyone can get polyps, but certain people are more likely than others. You may have a greater chance of getting polyps if:  You are over 50.   You have had polyps before.   Someone in your family has had polyps.   Someone in your family has had cancer of the large intestine.   Find out if someone in your family has had polyps. You may also be more likely to get polyps if you:   Eat a lot of fatty foods.   Smoke.   Drink alcohol.   Do not exercise.   Eat too much.    SYMPTOMS  Most small polyps do not cause symptoms. People often do not know they have one until their caregiver finds it during a regular checkup or while testing them for something else. Some people do have symptoms like these:  Bleeding from the anus. You might notice blood on your underwear or on toilet paper after you have had a bowel movement.   Constipation or diarrhea that lasts more than a week.   Blood in the stool. Blood can make stool look black or it can show up as red streaks in the stool.  If you have any of these symptoms, see your caregiver. HOW DOES THE DOCTOR TEST FOR POLYPS? The doctor can use four tests to check for polyps:  Digital rectal exam. The caregiver wears gloves and checks your rectum (the last part of the large intestine) to see if it feels normal. This test would find polyps only in the rectum. Your caregiver may need to do one of the other tests listed below to find polyps higher up in the intestine.   Barium enema. The caregiver puts a liquid called barium into your rectum before taking x-rays of your large intestine. Barium makes your intestine look white in the pictures. Polyps are dark, so they are easy to see.   Sigmoidoscopy. With this test, the caregiver can see inside your large intestine. A thin flexible tube is placed into your rectum. The device is called a sigmoidoscope, which has a light and a tiny video camera in it. The caregiver uses the sigmoidoscope to look at the last third of your large intestine.   Colonoscopy. This test is like sigmoidoscopy, but the caregiver looks at all of the large intestine. It usually requires sedation. This is the most common method for finding and removing polyps.  TREATMENT   The caregiver will remove the polyp during sigmoidoscopy or colonoscopy. The polyp is then tested for cancer.   If you have had polyps, your caregiver may want you to get tested regularly in the future.  PREVENTION  There is not one sure  way to prevent polyps. You might be able to lower your risk of getting them if you:  Eat more fruits and vegetables and less fatty food.   Do not smoke.   Avoid alcohol.   Exercise every day.   Lose weight if you are overweight.   Eating more calcium and folate can also lower your risk of getting polyps. Some foods that are rich in calcium are milk, cheese, and broccoli. Some foods that are rich in folate are chickpeas, kidney beans, and spinach.   Aspirin might help prevent polyps. Studies are under way.  Document Released: 08/26/2004 Document Revised: 11/19/2011 Document Reviewed: 02/01/2008 [facility] Patient Information 2012 [place], [place].  Diverticulosis Diverticulosis is a common condition that develops when small pouches (diverticula) form in the  wall of the colon. The risk of diverticulosis increases with age. It happens more often in people who eat a low-fiber diet. Most individuals with diverticulosis have no symptoms. Those individuals with symptoms usually experience abdominal pain, constipation, or loose stools (diarrhea). HOME CARE INSTRUCTIONS   Increase the amount of fiber in your diet as directed by your caregiver or dietician. This may reduce symptoms of diverticulosis.   Your caregiver may recommend taking a dietary fiber supplement.   Drink at least 6 to 8 glasses of water each day to prevent constipation.   Try not to strain when you have a bowel movement.   Your caregiver may recommend avoiding nuts and seeds to prevent complications, although this is still an uncertain benefit.   Only take over-the-counter or prescription medicines for pain, discomfort, or fever as directed by your caregiver.  FOODS WITH HIGH FIBER CONTENT INCLUDE:  Fruits. Apple, peach, pear, tangerine, raisins, prunes.   Vegetables. Brussels sprouts, asparagus, broccoli, cabbage, carrot, cauliflower, romaine lettuce, spinach, summer squash, tomato, winter squash, zucchini.   Starchy  Vegetables. Baked beans, kidney beans, lima beans, split peas, lentils, potatoes (with skin).   Grains. Whole wheat bread, brown rice, bran flake cereal, plain oatmeal, white rice, shredded wheat, bran muffins.  SEEK IMMEDIATE MEDICAL CARE IF:   You develop increasing pain or severe bloating.   You have an oral temperature above 102 F (38.9 C), not controlled by medicine.   You develop vomiting or bowel movements that are bloody or black.  Document Released: 08/27/2004 Document Revised: 11/19/2011 Document Reviewed: 04/30/2010 [facility] Patient Information 2012 [place], [place].

## 2012-03-31 NOTE — H&P (Signed)
Taylor Goodman is an 78 y.o. male.   Chief Complaint: Patient is in for colonoscopy. HPI: Patient is a 78 year old Caucasian male was in screening colonoscopy. This is patient's first exam. He denies abdominal pain change in his bowel habits or rectal bleeding. He does complain of intermittent self-limiting nausea. He was given PPI by his primary care physician it helped a little bit. He has never experienced vomiting. He denies weight loss. Family history significant for colon carcinoma and a sister who was diagnosed in her 62s and died in her 70s.  Past Medical History  Diagnosis Date  . Hypertension   . Hypercholesteremia   . Coronary artery disease   . Myocardial infarction   . Chronic back pain   . Peripheral vascular disease   . GERD (gastroesophageal reflux disease)     Past Surgical History  Procedure Date  . Coronary artery bypass graft   . Cholecystectomy   . Hernia repair     X 3  . Back surgery     Family History  Problem Relation Age of Onset  . Colon cancer Sister    Social History:  reports that he has quit smoking. His smoking use included Cigarettes. He has a 15 pack-year smoking history. He does not have any smokeless tobacco history on file. He reports that he does not drink alcohol or use illicit drugs.  Allergies: No Known Allergies  Medications Prior to Admission  Medication Dose Route Frequency Provider Last Rate Last Dose  . 0.45 % sodium chloride infusion   Intravenous Once Malissa Hippo, MD 20 mL/hr at 03/31/12 0959    . meperidine (DEMEROL) 50 MG/ML injection           . midazolam (VERSED) 5 MG/5ML injection            Medications Prior to Admission  Medication Sig Dispense Refill  . aspirin 81 MG tablet Take 81 mg by mouth daily.        No results found for this or any previous visit (from the past 48 hour(s)). No results found.  Review of Systems  Constitutional: Negative for weight loss.  Gastrointestinal: Negative for abdominal pain,  diarrhea, constipation, blood in stool and melena.    Blood pressure 191/107, pulse 99, temperature 97.8 F (36.6 C), temperature source Oral, resp. rate 19, height 5\' 7"  (1.702 m), weight 165 lb (74.844 kg), SpO2 99.00%. Physical Exam  Constitutional: He appears well-developed and well-nourished.  HENT:  Mouth/Throat: Oropharynx is clear and moist.  Eyes: Conjunctivae are normal. No scleral icterus.  Neck: No thyromegaly present.  Cardiovascular: Normal rate, regular rhythm and normal heart sounds.   No murmur heard. Respiratory: Effort normal and breath sounds normal.  GI: Soft. He exhibits no distension and no mass. There is no tenderness.  Musculoskeletal: He exhibits no edema.  Lymphadenopathy:    He has no cervical adenopathy.  Neurological: He is alert.  Skin: Skin is warm.     Assessment/Plan High-risk screening colonoscopy.   Moon Budde U 03/31/2012, 10:34 AM

## 2012-03-31 NOTE — Op Note (Signed)
COLONOSCOPY PROCEDURE REPORT  PATIENT:  Taylor Goodman  MR#:  [id number] Birthdate:  July 13, 1932, 78 y.o., male Endoscopist:  Dr. Malissa Hippo, MD Referred By:  Dr. [name], MD Procedure Date: 03/31/2012  Procedure:   Colonoscopy with snare polypectomy.  Indications:  Patient is 78 year old Caucasian male who is undergoing high-risk screening colonoscopy. Since patient's first exam. Patient's sister was diagnosed with colon carcinoma in her 30s and died in her 6s.  Informed Consent:  The procedure and risks were reviewed with the patient and informed consent was obtained.  Medications:  Demerol 50 mg IV Versed 4 mg IV  Description of procedure:  After a digital rectal exam was performed, that colonoscope was advanced from the anus through the rectum and colon to the area of the cecum, ileocecal valve and appendiceal orifice. The cecum was deeply intubated. These structures were well-seen and photographed for the record. From the level of the cecum and ileocecal valve, the scope was slowly and cautiously withdrawn. The mucosal surfaces were carefully surveyed utilizing scope tip to flexion to facilitate fold flattening as needed. The scope was pulled down into the rectum where a thorough exam including retroflexion was performed.  Findings:   Prep satisfactory. 6 mm polyp snared from the ascending colon. All millimeter polyp a bit of a cold biopsy from the hepatic flexure. Few scattered diverticula at sigmoid colon. Small hemorrhoids below the dentate line.  Therapeutic/Diagnostic Maneuvers Performed:  See above  Complications:  None  Cecal Withdrawal Time:  20 minutes  Impression:  Examination performed to cecum. 6 mm polyp snared from ascending colon. 4 mm polyp ablated up via cold biopsy from hepatic flexure. Mild sigmoid colonic diverticulosis. Mode external hemorrhoids.  Recommendations:  Standard instructions given. I will be contacting patient with  results of biopsy and further recommendations.  Katelynn Heidler U  03/31/2012 11:14 AM  CC: Dr. [name], MD, MD & Dr. Bonnetta Barry ref. provider found

## 2012-04-05 ENCOUNTER — Encounter (HOSPITAL_COMMUNITY): Payer: Self-pay | Admitting: Internal Medicine

## 2012-04-05 ENCOUNTER — Encounter (INDEPENDENT_AMBULATORY_CARE_PROVIDER_SITE_OTHER): Payer: Self-pay | Admitting: *Deleted

## 2013-03-29 ENCOUNTER — Other Ambulatory Visit (HOSPITAL_COMMUNITY): Payer: Self-pay | Admitting: Orthopedic Surgery

## 2013-03-29 DIAGNOSIS — M542 Cervicalgia: Secondary | ICD-10-CM

## 2013-04-05 ENCOUNTER — Ambulatory Visit (HOSPITAL_COMMUNITY)
Admission: RE | Admit: 2013-04-05 | Discharge: 2013-04-05 | Disposition: A | Discharge status: Home / Self Care | Payer: Medicare Other | Source: Ambulatory Visit | Attending: Orthopedic Surgery | Admitting: Orthopedic Surgery

## 2013-04-05 DIAGNOSIS — M47812 Spondylosis without myelopathy or radiculopathy, cervical region: Secondary | ICD-10-CM | POA: Insufficient documentation

## 2013-04-05 DIAGNOSIS — M25519 Pain in unspecified shoulder: Secondary | ICD-10-CM | POA: Insufficient documentation

## 2013-04-05 DIAGNOSIS — M5030 Other cervical disc degeneration, unspecified cervical region: Secondary | ICD-10-CM | POA: Insufficient documentation

## 2013-04-05 DIAGNOSIS — M542 Cervicalgia: Secondary | ICD-10-CM | POA: Insufficient documentation

## 2013-05-04 ENCOUNTER — Ambulatory Visit (INDEPENDENT_AMBULATORY_CARE_PROVIDER_SITE_OTHER): Payer: Medicare Other | Admitting: Internal Medicine

## 2013-05-04 ENCOUNTER — Encounter (INDEPENDENT_AMBULATORY_CARE_PROVIDER_SITE_OTHER): Payer: Self-pay | Admitting: Internal Medicine

## 2013-05-04 VITALS — BP 122/74 | HR 68 | Ht 67.0 in | Wt 147.7 lb

## 2013-05-04 DIAGNOSIS — I2510 Atherosclerotic heart disease of native coronary artery without angina pectoris: Secondary | ICD-10-CM | POA: Insufficient documentation

## 2013-05-04 DIAGNOSIS — R634 Abnormal weight loss: Secondary | ICD-10-CM

## 2013-05-04 DIAGNOSIS — E7800 Pure hypercholesterolemia, unspecified: Secondary | ICD-10-CM | POA: Insufficient documentation

## 2013-05-04 DIAGNOSIS — I739 Peripheral vascular disease, unspecified: Secondary | ICD-10-CM | POA: Insufficient documentation

## 2013-05-04 DIAGNOSIS — K219 Gastro-esophageal reflux disease without esophagitis: Secondary | ICD-10-CM | POA: Insufficient documentation

## 2013-05-04 DIAGNOSIS — I10 Essential (primary) hypertension: Secondary | ICD-10-CM | POA: Insufficient documentation

## 2013-05-04 NOTE — Patient Instructions (Addendum)
CBC, CT abdomen/pelvis with CM.  

## 2013-05-04 NOTE — Progress Notes (Signed)
Subjective:     Patient ID: Taylor Goodman, male   DOB: 1932-06-06, 79 y.o.   MRN: [id number]  HPI  Presents today with c/o sick spells. If he looks in his cupboard he feels sick and has to sit down. He has had dizziness x 5 yrs. He has seen Dr. Regino Schultze for the dizziness and was told he had inner ear. He c/o being dizzy. Very little acid reflux.  He does have some nausea, but no vomiting x 5 yrs. His appetite is good. He has had some weight loss unintentional (20 pounds) His BMs are normal. No melena or bright red rectal bleeding. He would like some samples of a PPI 03/31/2012 Colonoscopy with polypectomy:  Impression:  Examination performed to cecum.  6 mm polyp snared from ascending colon.  4 mm polyp ablated up via cold biopsy from hepatic flexure.  Mild sigmoid colonic diverticulosis.  Mode external hemorrhoids. Biopsy: PM One polyp is tubular adenoma and the second one is hyperplastic. Next colonoscopy in 5 years because of family history of CRC Report to PCP  CBC No results found for this basename: wbc, rbc, hgb, hct, plt, mcv, mch, mchc, rdw, neutrabs, lymphsabs, monoabs, eosabs, basosabs      Review of Systems see hpi Current Outpatient Prescriptions  Medication Sig Dispense Refill  . HYDROcodone-acetaminophen (LORCET) 10-650 MG per tablet Take 0.05-1 tablets by mouth 2 (two) times daily as needed. Pain      . lisinopril-hydrochlorothiazide (PRINZIDE,ZESTORETIC) 10-12.5 MG per tablet Take 1 tablet by mouth daily.      . simvastatin (ZOCOR) 40 MG tablet Take 40 mg by mouth daily.       No current facility-administered medications for this visit.   Past Medical History  Diagnosis Date  . Hypertension   . Hypercholesteremia   . Coronary artery disease   . Myocardial infarction   . Chronic back pain   . Peripheral vascular disease   . GERD (gastroesophageal reflux disease)    Past Surgical History  Procedure Laterality Date  . Coronary artery bypass graft    .  Cholecystectomy    . Hernia repair      X 3  . Back surgery    . Colonoscopy  03/31/2012    Procedure: COLONOSCOPY;  Surgeon: Malissa Hippo, MD;  Location: [facility];  Service: Endoscopy;  Laterality: N/A;  1030   No Known Allergies       Objective:   Physical Exam  Filed Vitals:   05/04/13 1609  BP: 122/74  Pulse: 68  Height: 5\' 7"  (1.702 m)  Weight: 147 lb 11.2 oz (66.996 kg)   . Alert and oriented. Skin warm and dry. Oral mucosa is moist.   . Sclera anicteric, conjunctivae is pink. Thyroid not enlarged. No cervical lymphadenopathy. Lungs clear. Heart regular rate and rhythm.  Abdomen is soft. Bowel sounds are positive. No hepatomegaly. No abdominal masses felt. No tenderness.  No edema to lower extremities.       Assessment:    GERD. He tells me he cannot afford Nexium. Will give him samples of Dexilant to try Weight loss.of almost 20 pounds in the past year. ? Etiology.     Plan:    Samples of Dexilant given to patient.   CT abdomen/pelvis with CM  Cmet, CBC today

## 2013-05-05 LAB — COMPREHENSIVE METABOLIC PANEL
BUN: 22 mg/dL (ref 6–23)
CO2: 26 mEq/L (ref 19–32)
Calcium: 9.5 mg/dL (ref 8.4–10.5)
Chloride: 103 mEq/L (ref 96–112)
Creat: 1.36 mg/dL — ABNORMAL HIGH (ref 0.50–1.35)
Glucose, Bld: 117 mg/dL — ABNORMAL HIGH (ref 70–99)
Total Bilirubin: 0.4 mg/dL (ref 0.3–1.2)

## 2013-05-05 LAB — CBC WITH DIFFERENTIAL/PLATELET
Eosinophils Absolute: 0.2 10*3/uL (ref 0.0–0.7)
Eosinophils Relative: 2 % (ref 0–5)
HCT: 44.6 % (ref 39.0–52.0)
Lymphocytes Relative: 33 % (ref 12–46)
Lymphs Abs: 2.3 10*3/uL (ref 0.7–4.0)
MCH: 29.8 pg (ref 26.0–34.0)
MCV: 88.7 fL (ref 78.0–100.0)
Monocytes Absolute: 0.7 10*3/uL (ref 0.1–1.0)
Monocytes Relative: 10 % (ref 3–12)
Platelets: 214 10*3/uL (ref 150–400)
RBC: 5.03 MIL/uL (ref 4.22–5.81)

## 2013-05-12 ENCOUNTER — Ambulatory Visit (HOSPITAL_COMMUNITY): Payer: Medicare Other

## 2013-05-17 ENCOUNTER — Ambulatory Visit (HOSPITAL_COMMUNITY)
Admission: RE | Admit: 2013-05-17 | Discharge: 2013-05-17 | Disposition: A | Discharge status: Home / Self Care | Payer: Medicare Other | Source: Ambulatory Visit | Attending: Internal Medicine | Admitting: Internal Medicine

## 2013-05-17 DIAGNOSIS — K7689 Other specified diseases of liver: Secondary | ICD-10-CM | POA: Insufficient documentation

## 2013-05-17 DIAGNOSIS — R634 Abnormal weight loss: Secondary | ICD-10-CM | POA: Insufficient documentation

## 2013-05-17 DIAGNOSIS — K5730 Diverticulosis of large intestine without perforation or abscess without bleeding: Secondary | ICD-10-CM | POA: Insufficient documentation

## 2013-05-17 DIAGNOSIS — R9389 Abnormal findings on diagnostic imaging of other specified body structures: Secondary | ICD-10-CM | POA: Insufficient documentation

## 2013-05-17 MED ORDER — IOHEXOL 300 MG/ML  SOLN
100.0000 mL | Freq: Once | INTRAMUSCULAR | Status: AC | PRN
  Administered 2013-05-17: 100 mL via INTRAVENOUS

## 2013-05-24 ENCOUNTER — Telehealth (INDEPENDENT_AMBULATORY_CARE_PROVIDER_SITE_OTHER): Payer: Self-pay | Admitting: *Deleted

## 2013-05-24 NOTE — Telephone Encounter (Signed)
This has been addressed. He needs an EGD

## 2013-05-24 NOTE — Telephone Encounter (Signed)
Taylor Goodman would like to get Remmy's CT results and find out if he needs an Endoscopy. Zafir is still having the upset stomach everyday. The return phone number is [phone].

## 2013-05-31 ENCOUNTER — Encounter (INDEPENDENT_AMBULATORY_CARE_PROVIDER_SITE_OTHER): Payer: Self-pay | Admitting: *Deleted

## 2013-06-07 ENCOUNTER — Other Ambulatory Visit (INDEPENDENT_AMBULATORY_CARE_PROVIDER_SITE_OTHER): Payer: Self-pay | Admitting: *Deleted

## 2013-06-07 ENCOUNTER — Encounter (INDEPENDENT_AMBULATORY_CARE_PROVIDER_SITE_OTHER): Payer: Self-pay | Admitting: *Deleted

## 2013-06-07 DIAGNOSIS — R110 Nausea: Secondary | ICD-10-CM

## 2013-06-21 ENCOUNTER — Encounter (HOSPITAL_COMMUNITY): Payer: Self-pay | Admitting: Pharmacy Technician

## 2013-07-05 ENCOUNTER — Ambulatory Visit (HOSPITAL_COMMUNITY)
Admission: RE | Admit: 2013-07-05 | Discharge: 2013-07-05 | Disposition: A | Discharge status: Home / Self Care | Payer: Medicare Other | Source: Ambulatory Visit | Attending: Internal Medicine | Admitting: Internal Medicine

## 2013-07-05 ENCOUNTER — Encounter (HOSPITAL_COMMUNITY)
Admission: RE | Disposition: A | Discharge status: Home / Self Care | Payer: Self-pay | Source: Ambulatory Visit | Attending: Internal Medicine

## 2013-07-05 ENCOUNTER — Encounter (HOSPITAL_COMMUNITY): Payer: Self-pay | Admitting: *Deleted

## 2013-07-05 DIAGNOSIS — I10 Essential (primary) hypertension: Secondary | ICD-10-CM | POA: Insufficient documentation

## 2013-07-05 DIAGNOSIS — D131 Benign neoplasm of stomach: Secondary | ICD-10-CM | POA: Insufficient documentation

## 2013-07-05 DIAGNOSIS — R110 Nausea: Secondary | ICD-10-CM | POA: Insufficient documentation

## 2013-07-05 DIAGNOSIS — K219 Gastro-esophageal reflux disease without esophagitis: Secondary | ICD-10-CM

## 2013-07-05 DIAGNOSIS — K449 Diaphragmatic hernia without obstruction or gangrene: Secondary | ICD-10-CM

## 2013-07-05 DIAGNOSIS — K2100 Gastro-esophageal reflux disease with esophagitis, without bleeding: Secondary | ICD-10-CM | POA: Insufficient documentation

## 2013-07-05 DIAGNOSIS — R634 Abnormal weight loss: Secondary | ICD-10-CM

## 2013-07-05 HISTORY — PX: ESOPHAGOGASTRODUODENOSCOPY: SHX5428

## 2013-07-05 SURGERY — EGD (ESOPHAGOGASTRODUODENOSCOPY)
Anesthesia: Moderate Sedation

## 2013-07-05 MED ORDER — MIDAZOLAM HCL 5 MG/5ML IJ SOLN
INTRAMUSCULAR | Status: AC
  Filled 2013-07-05: qty 10

## 2013-07-05 MED ORDER — MEPERIDINE HCL 50 MG/ML IJ SOLN
INTRAMUSCULAR | Status: AC
  Filled 2013-07-05: qty 1

## 2013-07-05 MED ORDER — MIDAZOLAM HCL 5 MG/5ML IJ SOLN
INTRAMUSCULAR | Status: DC | PRN
  Administered 2013-07-05 (×2): 2 mg via INTRAVENOUS

## 2013-07-05 MED ORDER — STERILE WATER FOR IRRIGATION IR SOLN
Status: DC | PRN
  Administered 2013-07-05: 12:00:00

## 2013-07-05 MED ORDER — SODIUM CHLORIDE 0.9 % IV SOLN
INTRAVENOUS | Status: DC
  Administered 2013-07-05: 11:00:00 via INTRAVENOUS

## 2013-07-05 MED ORDER — MEPERIDINE HCL 25 MG/ML IJ SOLN
INTRAMUSCULAR | Status: DC | PRN
  Administered 2013-07-05 (×2): 25 mg via INTRAVENOUS

## 2013-07-05 MED ORDER — BUTAMBEN-TETRACAINE-BENZOCAINE 2-2-14 % EX AERO
INHALATION_SPRAY | CUTANEOUS | Status: DC | PRN
  Administered 2013-07-05: 2 via TOPICAL

## 2013-07-05 MED ORDER — ONDANSETRON 4 MG PO TBDP: 4.0000 mg | ORAL_TABLET | Freq: Every day | ORAL | Status: DC

## 2013-07-05 NOTE — Op Note (Addendum)
EGD PROCEDURE REPORT  PATIENT:  Taylor Goodman  MR#:  [id number] Birthdate:  08-28-1932, 79 y.o., male Endoscopist:  Dr. Malissa Hippo, MD Referred By:  Dr. Lowell Guitar, MD Procedure Date: 07/05/2013  Procedure:   EGD  Indications:  Patient is 79 year old Caucasian male who presents with chronic nausea lately occurring on daily basis. He has lost 20 pounds over the last one year but claims he has a good appetite. PPI therapy has not provided relief. CBC, LFT serum calcium levels are normal. No abnormality noted on abdominopelvic CT to account for symptoms. He is undergoing diagnostic EGD.            Informed Consent:  The risks, benefits, alternatives & imponderables which include, but are not limited to, bleeding, infection, perforation, drug reaction and potential missed lesion have been reviewed.  The potential for biopsy, lesion removal, esophageal dilation, etc. have also been discussed.  Questions have been answered.  All parties agreeable.  Please see history & physical in medical record for more information.  Medications:  Demerol 50 mg IV Versed 5 mg IV Cetacaine spray topically for oropharyngeal anesthesia  Description of procedure:  The endoscope was introduced through the mouth and advanced to the second portion of the duodenum without difficulty or limitations. The mucosal surfaces were surveyed very carefully during advancement of the scope and upon withdrawal.  Findings:  Esophagus:  Mucosa of the esophagus was normal. Focal linear erythema noted at GE junction which was a baby. No erosions or ulcers noted. GEJ:  38 cm Hiatus:  40cm Stomach:  Stomach was empty and distended very well with insufflation. Folds in the proximal stomach was normal. Few 2-3 mm hyperplastic-appearing polyps noted the gastric body and fundus. Antral mucosa was normal. Pyloric channel was patent. Angularis was unremarkable. Duodenum:  Normal bulbar and post bulbar  mucosa  Therapeutic/Diagnostic Maneuvers Performed:  None  Complications:  None  Impression: Mild changes of reflux esophagitis limited to GE junction and a small sliding hiatal hernia. Few small hyperplstic appearing gastric polyps which were left alone. No evidence of peptic ulcer disease or pyloric stenosis.  Recommendations:  Continue anti-reflux measures. Continue Esomeprazole 40 mg by mouth every morning. Ondansetron 4 mg by mouth every morning. Patient advised to keep symptom diary until office visit in 4 weeks.   Jermiya Reichl U  07/05/2013  12:08 PM  CC: Dr. [name], MD & Dr. Bonnetta Barry ref. provider found

## 2013-07-05 NOTE — H&P (Addendum)
Taylor Goodman is an 79 y.o. male.   Chief Complaint: Patient's here for EGD. HPI: Patient is 79 year old Caucasian male who presents chronic intermittent nausea. These symptoms started about 5 years ago but lately smoking every day. His appetite he has lost 20 pounds last year. This this was noted by Dr. Regino Schultze his primary care physician. He denies frequent heartburn dysphagia vomiting melena rectal bleeding or abdominal pain. Patient does not take OTC NSAIDs except low-dose aspirin. Recent blood work includes a normal CBC and chemistry panel except serum creatinine of 1.36. He also had abdominopelvic CT and no abnormality noted to account for his symptoms. He quit cigarette smoking in 1987 and he also quit drinking alcohol. He can only walk half a block because of intermittent claudication.  Past Medical History  Diagnosis Date  . Hypertension   . Hypercholesteremia   . Coronary artery disease   . Myocardial infarction   . Chronic back pain   . Peripheral vascular disease   . GERD (gastroesophageal reflux disease)     Past Surgical History  Procedure Laterality Date  . Coronary artery bypass graft    . Cholecystectomy    . Hernia repair      X 3  . Back surgery    . Colonoscopy  03/31/2012    Procedure: COLONOSCOPY;  Surgeon: Malissa Hippo, MD;  Location: [facility];  Service: Endoscopy;  Laterality: N/A;  1030    Family History  Problem Relation Age of Onset  . Colon cancer Sister   . Pancreatic cancer Paternal Uncle    Social History:  reports that he has quit smoking. His smoking use included Cigarettes. He has a 15 pack-year smoking history. He does not have any smokeless tobacco history on file. He reports that he does not drink alcohol or use illicit drugs.  Allergies: No Known Allergies  Medications Prior to Admission  Medication Sig Dispense Refill  . ALPRAZolam (XANAX) 0.5 MG tablet Take 1 tablet by mouth 3 (three) times daily.      Marland Kitchen  HYDROcodone-acetaminophen (LORCET) 10-650 MG per tablet Take 0.05-1 tablets by mouth 2 (two) times daily as needed. Pain      . lisinopril-hydrochlorothiazide (PRINZIDE,ZESTORETIC) 10-12.5 MG per tablet Take 1 tablet by mouth daily.      . simvastatin (ZOCOR) 40 MG tablet Take 40 mg by mouth daily.      Marland Kitchen zolpidem (AMBIEN) 10 MG tablet Take 1 tablet by mouth at bedtime as needed for sleep.         No results found for this or any previous visit (from the past 48 hour(s)). No results found.  ROS  Blood pressure 171/83, pulse 71, temperature 98 F (36.7 C), temperature source Oral, resp. rate 18, SpO2 96.00%. Physical Exam  Constitutional:  Well-developed thin Caucasian male in NAD  HENT:  Mouth/Throat: Oropharynx is clear and moist.  Eyes: Conjunctivae are normal. No scleral icterus.  Neck: No thyromegaly present.  Cardiovascular: Normal rate, regular rhythm and normal heart sounds.   No murmur heard. Respiratory: Effort normal and breath sounds normal.  GI: Soft. He exhibits no distension and no mass. There is no tenderness.  Musculoskeletal: He exhibits no edema.  Lymphadenopathy:    He has no cervical adenopathy.  Neurological: He is alert.  Skin: Skin is warm and dry.     Assessment/Plan Chronic nausea. Weight loss. Diagnostic EGD.  REHMAN,NAJEEB U 07/05/2013, 11:43 AM

## 2013-07-06 ENCOUNTER — Encounter (HOSPITAL_COMMUNITY): Payer: Self-pay | Admitting: Internal Medicine

## 2013-08-04 ENCOUNTER — Telehealth (INDEPENDENT_AMBULATORY_CARE_PROVIDER_SITE_OTHER): Payer: Self-pay | Admitting: *Deleted

## 2013-08-04 NOTE — Telephone Encounter (Signed)
Harriett Sine said Taylor Goodman just had a procedure on 07/05/13 and he is doing nothing but staying sick with vomiting. She is very concerned about him. What would be the next step to find out what is making him stay so sick? Should she take him to the ED or is there something Dr. Karilyn Cota can do? Please return her call at [phone].

## 2013-08-04 NOTE — Telephone Encounter (Signed)
Apt has been scheduled with Dr. Karilyn Cota for Monday, 08/07/13.

## 2013-08-04 NOTE — Telephone Encounter (Signed)
I have talked with Taylor Goodman, she states that he did not eat anything all day yesterday, he has used the Zofran but it is not helping at all. She says that he ahs had both procedures and she would like to know what Dr.Rehman's next step is and what should she do. She was advised that Dr.Rehman would be contacted and I would call her back.

## 2013-08-04 NOTE — Telephone Encounter (Signed)
Per Dr.Rehman we can make an appointment for him to come in on Monday. Mrs. Sawaya should take him to PCP for evaluation ,and if she feels that he should go to ED that would be her decision Wife called and given Dr.Rehman's recommendation.

## 2013-08-07 ENCOUNTER — Ambulatory Visit (INDEPENDENT_AMBULATORY_CARE_PROVIDER_SITE_OTHER): Payer: Medicare Other | Admitting: Internal Medicine

## 2013-08-07 ENCOUNTER — Encounter (INDEPENDENT_AMBULATORY_CARE_PROVIDER_SITE_OTHER): Payer: Self-pay | Admitting: Internal Medicine

## 2013-08-07 ENCOUNTER — Telehealth (INDEPENDENT_AMBULATORY_CARE_PROVIDER_SITE_OTHER): Payer: Self-pay | Admitting: *Deleted

## 2013-08-07 ENCOUNTER — Other Ambulatory Visit (INDEPENDENT_AMBULATORY_CARE_PROVIDER_SITE_OTHER): Payer: Self-pay | Admitting: Internal Medicine

## 2013-08-07 VITALS — BP 126/72 | HR 76 | Temp 97.1°F | Resp 18 | Ht 67.0 in | Wt 148.9 lb

## 2013-08-07 DIAGNOSIS — N3289 Other specified disorders of bladder: Secondary | ICD-10-CM

## 2013-08-07 DIAGNOSIS — Z87440 Personal history of urinary (tract) infections: Secondary | ICD-10-CM

## 2013-08-07 DIAGNOSIS — F329 Major depressive disorder, single episode, unspecified: Secondary | ICD-10-CM

## 2013-08-07 DIAGNOSIS — F3289 Other specified depressive episodes: Secondary | ICD-10-CM

## 2013-08-07 DIAGNOSIS — R112 Nausea with vomiting, unspecified: Secondary | ICD-10-CM

## 2013-08-07 DIAGNOSIS — R634 Abnormal weight loss: Secondary | ICD-10-CM

## 2013-08-07 MED ORDER — MEGESTROL ACETATE 40 MG/ML PO SUSP: 200.0000 mg | Freq: Every day | ORAL | Status: DC

## 2013-08-07 MED ORDER — CITALOPRAM HYDROBROMIDE 20 MG PO TABS: 20.0000 mg | ORAL_TABLET | Freq: Every day | ORAL | Status: DC

## 2013-08-07 NOTE — Patient Instructions (Signed)
Take Prozac every other day for 2 weeks and stop. Appointment with urologist to be arranged. Physician will contact her with results of blood work and urinalysis. Office visit in 6 weeks.

## 2013-08-07 NOTE — Progress Notes (Signed)
Presenting complaint;  Nausea, anorexia and weight loss.  Subjective:  Patient is 79 year old Caucasian male who is here for scheduled visit. He was initially seen on 05/04/2013 for these symptoms. He had CBC and comprehensive chemistry panel which were unremarkable. He had abdominopelvic CT on 05/31/2013 wrist and just interstitial lung disease coronary calcification with normal size heart rate atherosclerotic changes to the abdominal aorta with focal bulge at L2/L3, Atrosept aright changes to iliac arteries with narrowing, sigmoid diverticulosis and atrophic left kidney. He also bladder wall thickening more than expected based on underdistention. He also had the calcification involving the prostate. He did not respond to PPI therapy. He underwent EGD on 07/05/2013 revealing mild changes of reflux esophagitis limited to the GE junction along with small sliding-type hernia and few small hyperplastic gastric polyps which were left alone. Patient was advised to take ondansetron 4 mg every morning. Today he is accompanied by his wife. He does not talk much. Most of the history is provided by his wife. He apparently has had nausea for 5 years but lately has become intractable and he has noted a drop in his appetite. He states his weight used to be 166 pounds. He has lost about 18 pounds. He has had 2 episodes of vomiting in the last month. On both occasions he vomited liquid. At other times he has intense nausea but never throws up. He has very few moments where he is free of nausea. He does not feel medication is helping him. He denies abdominal pain melena or rectal bleeding. He also denies dysuria or hematuria. A few weeks ago he was treated by Dr. Yetta Numbers for urinary tract infection. Patient states he does not feel depressed. However he is taking his wife's Prozac 20 mg a day which he has been for 4-5 months. He feels very weak and does not feel like doing anything. He denies fever chills or night  sweats. He has 5 children. He has some contact with his daughter but not with other children. He quit cigarette smoking in 1987 and he does not drink alcohol.   Current Medications: Current Outpatient Prescriptions  Medication Sig Dispense Refill  . ALPRAZolam (XANAX) 0.5 MG tablet Take 1 tablet by mouth 3 (three) times daily.      Marland Kitchen esomeprazole (NEXIUM) 40 MG capsule Take 40 mg by mouth daily.      Marland Kitchen HYDROcodone-acetaminophen (LORCET) 10-650 MG per tablet Take 0.05-1 tablets by mouth 2 (two) times daily as needed. Pain      . lisinopril-hydrochlorothiazide (PRINZIDE,ZESTORETIC) 10-12.5 MG per tablet Take 1 tablet by mouth daily.      . ondansetron (ZOFRAN-ODT) 4 MG disintegrating tablet Take 1 tablet (4 mg total) by mouth daily before breakfast.  30 tablet  0  . simvastatin (ZOCOR) 40 MG tablet Take 40 mg by mouth daily.      Marland Kitchen zolpidem (AMBIEN) 10 MG tablet Take 1 tablet by mouth at bedtime as needed for sleep.        No current facility-administered medications for this visit.     Objective: Blood pressure 126/72, pulse 76, temperature 97.1 F (36.2 C), temperature source Oral, resp. rate 18, height 5\' 7"  (1.702 m), weight 148 lb 14.4 oz (67.541 kg). Well-developed thin Caucasian male who is in no acute distress. Conjunctiva is pink. Sclera is nonicteric Oropharyngeal mucosa is normal. He has dentures in place. No neck masses or thyromegaly noted. Cardiac exam with regular rhythm normal S1 and S2. No murmur or gallop noted.  Lungs are clear to auscultation. Abdomen is flat. Bowel sounds are normal. No bruits noted. Abdomen is soft and nontender without organomegaly or masses. No LE edema or clubbing noted.   Assessment:  #1. Patient's symptom complex of nausea anorexia and weight loss would appear to be secondary to depression since no other causes been found up to this point. It is interesting to note that he's been taking his wife's Prozac the last 4-5 months. He certainly  could be dealing with occult process. #2. Bladder wall thickening. This abnormality needs to be further evaluated.  Plan:  Patient will go to lab for sedimentation rate comprehensive chemistry panel and urine analysis. Will request urologic consultation. Decrease Prozac to 20 mg every other day for 2 weeks and stop. Citalopram 20 mg by mouth daily. Megestrol 20 mg by mouth daily. Office visit in 6 weeks to

## 2013-08-07 NOTE — Telephone Encounter (Signed)
Taylor Goodman forgot to let Dr. Karilyn Cota know that she thinks Taylor Goodman is in early stages of demintia.

## 2013-08-07 NOTE — Telephone Encounter (Signed)
This will be shared with Dr.Rehman. 

## 2013-08-08 LAB — COMPREHENSIVE METABOLIC PANEL
Alkaline Phosphatase: 69 U/L (ref 39–117)
BUN: 13 mg/dL (ref 6–23)
CO2: 29 mEq/L (ref 19–32)
Creat: 0.8 mg/dL (ref 0.50–1.35)
Glucose, Bld: 124 mg/dL — ABNORMAL HIGH (ref 70–99)
Sodium: 140 mEq/L (ref 135–145)
Total Bilirubin: 0.7 mg/dL (ref 0.3–1.2)

## 2013-08-08 LAB — SEDIMENTATION RATE: Sed Rate: 4 mm/hr (ref 0–16)

## 2013-08-08 LAB — URINALYSIS
Bilirubin Urine: NEGATIVE
Glucose, UA: NEGATIVE mg/dL
Protein, ur: NEGATIVE mg/dL
Urobilinogen, UA: 0.2 mg/dL (ref 0.0–1.0)

## 2013-08-10 LAB — URINE CULTURE: Colony Count: >=100000

## 2013-08-11 ENCOUNTER — Other Ambulatory Visit (INDEPENDENT_AMBULATORY_CARE_PROVIDER_SITE_OTHER): Payer: Self-pay | Admitting: Internal Medicine

## 2013-08-11 MED ORDER — CIPROFLOXACIN HCL 500 MG PO TABS: 500.0000 mg | ORAL_TABLET | Freq: Two times a day (BID) | ORAL | Status: DC

## 2013-08-15 ENCOUNTER — Telehealth (INDEPENDENT_AMBULATORY_CARE_PROVIDER_SITE_OTHER): Payer: Self-pay | Admitting: *Deleted

## 2013-08-15 NOTE — Telephone Encounter (Signed)
[facility] apt has been moved up to 08/813 with Dr.Scott MacDiarmid. Wife, Harriett Sine, has been advised and voices understood.  Harriett Sine would like for Dr. Karilyn Cota to know Esaias went crazy on the Celexa and started throwing things away, not taking his medications, etc. Traylen is still refusing to take his medicines and the insurance company will not approved the medication to help him eat. She feels the Prozac helped him more. She can be reached at [phone] or [phone].

## 2013-08-16 NOTE — Telephone Encounter (Signed)
I reviewed this message with Dr.Rehman on 09/02/014. He states that he does not fill that the Celexa caused the patient to act this way, and if Mrs. Seifer feels that the Prozac that she was giving him ( her prescription) is helping her husband that she should talk with Dr. Sherwood Gambler, PCP, about prescribing it for him. That he would not do this. Mrs. Fails was called and a message was left on her cell phone with these recommendations. Also she was advised that the Megestrol had not been denied that additional information was being sent and once we rec'd a reply from them we would let her know that outcome.

## 2013-09-19 ENCOUNTER — Ambulatory Visit (INDEPENDENT_AMBULATORY_CARE_PROVIDER_SITE_OTHER): Payer: Medicare Other | Admitting: Internal Medicine

## 2013-11-14 ENCOUNTER — Ambulatory Visit (INDEPENDENT_AMBULATORY_CARE_PROVIDER_SITE_OTHER): Payer: Medicare Other | Admitting: Internal Medicine

## 2013-12-07 ENCOUNTER — Emergency Department (HOSPITAL_COMMUNITY): Payer: Medicare Other

## 2013-12-07 ENCOUNTER — Encounter (HOSPITAL_COMMUNITY): Payer: Self-pay | Admitting: Emergency Medicine

## 2013-12-07 ENCOUNTER — Inpatient Hospital Stay (HOSPITAL_COMMUNITY)
Admission: EM | Admit: 2013-12-07 | Discharge: 2013-12-15 | DRG: 286 | Disposition: A | Discharge status: Home Health | Payer: Medicare Other | Attending: Cardiovascular Disease | Admitting: Cardiovascular Disease

## 2013-12-07 DIAGNOSIS — Z7982 Long term (current) use of aspirin: Secondary | ICD-10-CM

## 2013-12-07 DIAGNOSIS — I10 Essential (primary) hypertension: Secondary | ICD-10-CM | POA: Diagnosis present

## 2013-12-07 DIAGNOSIS — Z9089 Acquired absence of other organs: Secondary | ICD-10-CM

## 2013-12-07 DIAGNOSIS — E119 Type 2 diabetes mellitus without complications: Secondary | ICD-10-CM | POA: Diagnosis present

## 2013-12-07 DIAGNOSIS — I5043 Acute on chronic combined systolic (congestive) and diastolic (congestive) heart failure: Principal | ICD-10-CM | POA: Diagnosis present

## 2013-12-07 DIAGNOSIS — J84112 Idiopathic pulmonary fibrosis: Secondary | ICD-10-CM | POA: Diagnosis present

## 2013-12-07 DIAGNOSIS — R634 Abnormal weight loss: Secondary | ICD-10-CM

## 2013-12-07 DIAGNOSIS — R0902 Hypoxemia: Secondary | ICD-10-CM

## 2013-12-07 DIAGNOSIS — J9620 Acute and chronic respiratory failure, unspecified whether with hypoxia or hypercapnia: Secondary | ICD-10-CM | POA: Diagnosis present

## 2013-12-07 DIAGNOSIS — F329 Major depressive disorder, single episode, unspecified: Secondary | ICD-10-CM

## 2013-12-07 DIAGNOSIS — I2589 Other forms of chronic ischemic heart disease: Secondary | ICD-10-CM | POA: Diagnosis present

## 2013-12-07 DIAGNOSIS — M545 Low back pain, unspecified: Secondary | ICD-10-CM | POA: Diagnosis present

## 2013-12-07 DIAGNOSIS — I739 Peripheral vascular disease, unspecified: Secondary | ICD-10-CM | POA: Diagnosis present

## 2013-12-07 DIAGNOSIS — Z87891 Personal history of nicotine dependence: Secondary | ICD-10-CM

## 2013-12-07 DIAGNOSIS — K219 Gastro-esophageal reflux disease without esophagitis: Secondary | ICD-10-CM | POA: Diagnosis present

## 2013-12-07 DIAGNOSIS — J811 Chronic pulmonary edema: Secondary | ICD-10-CM | POA: Diagnosis present

## 2013-12-07 DIAGNOSIS — I5023 Acute on chronic systolic (congestive) heart failure: Secondary | ICD-10-CM | POA: Diagnosis present

## 2013-12-07 DIAGNOSIS — I252 Old myocardial infarction: Secondary | ICD-10-CM

## 2013-12-07 DIAGNOSIS — G8929 Other chronic pain: Secondary | ICD-10-CM | POA: Diagnosis present

## 2013-12-07 DIAGNOSIS — I472 Ventricular tachycardia, unspecified: Secondary | ICD-10-CM | POA: Diagnosis not present

## 2013-12-07 DIAGNOSIS — I2510 Atherosclerotic heart disease of native coronary artery without angina pectoris: Secondary | ICD-10-CM | POA: Diagnosis present

## 2013-12-07 DIAGNOSIS — E8881 Metabolic syndrome: Secondary | ICD-10-CM | POA: Diagnosis present

## 2013-12-07 DIAGNOSIS — Z79899 Other long term (current) drug therapy: Secondary | ICD-10-CM

## 2013-12-07 DIAGNOSIS — I4729 Other ventricular tachycardia: Secondary | ICD-10-CM | POA: Diagnosis not present

## 2013-12-07 DIAGNOSIS — I509 Heart failure, unspecified: Secondary | ICD-10-CM | POA: Diagnosis present

## 2013-12-07 DIAGNOSIS — E7800 Pure hypercholesterolemia, unspecified: Secondary | ICD-10-CM | POA: Diagnosis present

## 2013-12-07 DIAGNOSIS — I25810 Atherosclerosis of coronary artery bypass graft(s) without angina pectoris: Secondary | ICD-10-CM | POA: Diagnosis present

## 2013-12-07 DIAGNOSIS — Z9189 Other specified personal risk factors, not elsewhere classified: Secondary | ICD-10-CM | POA: Diagnosis present

## 2013-12-07 DIAGNOSIS — E785 Hyperlipidemia, unspecified: Secondary | ICD-10-CM | POA: Diagnosis present

## 2013-12-07 DIAGNOSIS — Z66 Do not resuscitate: Secondary | ICD-10-CM | POA: Diagnosis present

## 2013-12-07 DIAGNOSIS — Z800 Family history of malignant neoplasm of digestive organs: Secondary | ICD-10-CM

## 2013-12-07 DIAGNOSIS — I255 Ischemic cardiomyopathy: Secondary | ICD-10-CM | POA: Diagnosis present

## 2013-12-07 DIAGNOSIS — R0602 Shortness of breath: Secondary | ICD-10-CM

## 2013-12-07 DIAGNOSIS — J439 Emphysema, unspecified: Secondary | ICD-10-CM | POA: Diagnosis present

## 2013-12-07 DIAGNOSIS — Z23 Encounter for immunization: Secondary | ICD-10-CM

## 2013-12-07 HISTORY — DX: Acute on chronic systolic (congestive) heart failure: I50.23

## 2013-12-07 HISTORY — DX: Hypoxemia: R09.02

## 2013-12-07 HISTORY — DX: Ischemic cardiomyopathy: I25.5

## 2013-12-07 HISTORY — DX: Other specified personal risk factors, not elsewhere classified: Z91.89

## 2013-12-07 HISTORY — DX: Emphysema, unspecified: J43.9

## 2013-12-07 HISTORY — DX: Metabolic syndrome: E88.81

## 2013-12-07 HISTORY — DX: Dilated cardiomyopathy: I42.0

## 2013-12-07 HISTORY — DX: Atherosclerosis of coronary artery bypass graft(s) without angina pectoris: I25.810

## 2013-12-07 LAB — INFLUENZA PANEL BY PCR (TYPE A & B)
H1N1 flu by pcr: NOT DETECTED
Influenza A By PCR: NEGATIVE
Influenza B By PCR: NEGATIVE

## 2013-12-07 LAB — BASIC METABOLIC PANEL
BUN: 26 mg/dL — ABNORMAL HIGH (ref 6–23)
CO2: 21 mEq/L (ref 19–32)
Chloride: 101 mEq/L (ref 96–112)
GFR calc Af Amer: 63 mL/min — ABNORMAL LOW (ref >90)
GFR calc non Af Amer: 55 mL/min — ABNORMAL LOW (ref >90)
Glucose, Bld: 205 mg/dL — ABNORMAL HIGH (ref 70–99)
Potassium: 4.3 mEq/L (ref 3.5–5.1)
Sodium: 139 mEq/L (ref 135–145)

## 2013-12-07 LAB — TROPONIN I
Troponin I: <0.3 ng/mL (ref <0.30)
Troponin I: <0.3 ng/mL (ref <0.30)

## 2013-12-07 LAB — GLUCOSE, CAPILLARY
Glucose-Capillary: 139 mg/dL — ABNORMAL HIGH (ref 70–99)
Glucose-Capillary: 153 mg/dL — ABNORMAL HIGH (ref 70–99)

## 2013-12-07 LAB — CBC
HCT: 43.4 % (ref 39.0–52.0)
Hemoglobin: 14.3 g/dL (ref 13.0–17.0)
MCH: 30.4 pg (ref 26.0–34.0)
MCHC: 32.9 g/dL (ref 30.0–36.0)
RBC: 4.71 MIL/uL (ref 4.22–5.81)
WBC: 8.3 10*3/uL (ref 4.0–10.5)

## 2013-12-07 LAB — LACTIC ACID, PLASMA: Lactic Acid, Venous: 3.1 mmol/L — ABNORMAL HIGH (ref 0.5–2.2)

## 2013-12-07 LAB — PRO B NATRIURETIC PEPTIDE: Pro B Natriuretic peptide (BNP): 21216 pg/mL — ABNORMAL HIGH (ref 0–450)

## 2013-12-07 MED ORDER — ALPRAZOLAM 0.25 MG PO TABS
0.5000 mg | ORAL_TABLET | Freq: Three times a day (TID) | ORAL | Status: DC
  Administered 2013-12-07 – 2013-12-08 (×4): 0.5 mg via ORAL
  Filled 2013-12-07 (×2): qty 2
  Filled 2013-12-07: qty 1
  Filled 2013-12-07: qty 2
  Filled 2013-12-07 (×4): qty 1
  Filled 2013-12-07: qty 2

## 2013-12-07 MED ORDER — HEPARIN SODIUM (PORCINE) 5000 UNIT/ML IJ SOLN
5000.0000 [IU] | Freq: Three times a day (TID) | INTRAMUSCULAR | Status: DC
Start: 2013-12-07 — End: 2013-12-15
  Administered 2013-12-07 – 2013-12-15 (×21): 5000 [IU] via SUBCUTANEOUS
  Filled 2013-12-07 (×27): qty 1

## 2013-12-07 MED ORDER — SODIUM CHLORIDE 0.9 % IJ SOLN
3.0000 mL | Freq: Two times a day (BID) | INTRAMUSCULAR | Status: DC
  Administered 2013-12-07 – 2013-12-11 (×6): 3 mL via INTRAVENOUS

## 2013-12-07 MED ORDER — FUROSEMIDE 10 MG/ML IJ SOLN
40.0000 mg | Freq: Once | INTRAMUSCULAR | Status: AC
  Administered 2013-12-07: 40 mg via INTRAVENOUS
  Filled 2013-12-07: qty 4

## 2013-12-07 MED ORDER — PANTOPRAZOLE SODIUM 40 MG PO TBEC
80.0000 mg | DELAYED_RELEASE_TABLET | Freq: Every day | ORAL | Status: DC
  Administered 2013-12-07 – 2013-12-15 (×8): 80 mg via ORAL
  Filled 2013-12-07 (×8): qty 2

## 2013-12-07 MED ORDER — MEGESTROL ACETATE 400 MG/10ML PO SUSP
200.0000 mg | Freq: Every day | ORAL | Status: DC
  Administered 2013-12-07 – 2013-12-15 (×6): 200 mg via ORAL
  Filled 2013-12-07 (×6): qty 5
  Filled 2013-12-07: qty 10
  Filled 2013-12-07 (×2): qty 5

## 2013-12-07 MED ORDER — CITALOPRAM HYDROBROMIDE 20 MG PO TABS
20.0000 mg | ORAL_TABLET | Freq: Every day | ORAL | Status: DC
  Administered 2013-12-07 – 2013-12-15 (×8): 20 mg via ORAL
  Filled 2013-12-07 (×10): qty 1

## 2013-12-07 MED ORDER — ASPIRIN EC 81 MG PO TBEC
81.0000 mg | DELAYED_RELEASE_TABLET | Freq: Every day | ORAL | Status: DC
  Administered 2013-12-07 – 2013-12-10 (×4): 81 mg via ORAL
  Filled 2013-12-07 (×4): qty 1

## 2013-12-07 MED ORDER — GUAIFENESIN-DM 100-10 MG/5ML PO SYRP: 5.0000 mL | ORAL_SOLUTION | ORAL | Status: DC | PRN

## 2013-12-07 MED ORDER — ONDANSETRON HCL 4 MG/2ML IJ SOLN: 4.0000 mg | Freq: Four times a day (QID) | INTRAMUSCULAR | Status: DC | PRN

## 2013-12-07 MED ORDER — NITROGLYCERIN 2 % TD OINT
0.5000 [in_us] | TOPICAL_OINTMENT | Freq: Four times a day (QID) | TRANSDERMAL | Status: DC
  Administered 2013-12-07: 0.5 [in_us] via TOPICAL
  Filled 2013-12-07: qty 1

## 2013-12-07 MED ORDER — ALBUTEROL SULFATE (5 MG/ML) 0.5% IN NEBU: 2.5000 mg | INHALATION_SOLUTION | RESPIRATORY_TRACT | Status: DC | PRN

## 2013-12-07 MED ORDER — SODIUM CHLORIDE 0.9 % IV BOLUS (SEPSIS)
500.0000 mL | Freq: Once | INTRAVENOUS | Status: AC
  Administered 2013-12-07: 500 mL via INTRAVENOUS

## 2013-12-07 MED ORDER — INSULIN ASPART 100 UNIT/ML ~~LOC~~ SOLN
0.0000 [IU] | Freq: Three times a day (TID) | SUBCUTANEOUS | Status: DC
  Administered 2013-12-07: 1 [IU] via SUBCUTANEOUS
  Administered 2013-12-08: 2 [IU] via SUBCUTANEOUS
  Administered 2013-12-08: 1 [IU] via SUBCUTANEOUS
  Administered 2013-12-08: 2 [IU] via SUBCUTANEOUS
  Administered 2013-12-10 (×2): 1 [IU] via SUBCUTANEOUS
  Administered 2013-12-10: 2 [IU] via SUBCUTANEOUS
  Administered 2013-12-11 – 2013-12-14 (×5): 1 [IU] via SUBCUTANEOUS

## 2013-12-07 MED ORDER — IOHEXOL 350 MG/ML SOLN
100.0000 mL | Freq: Once | INTRAVENOUS | Status: AC | PRN
  Administered 2013-12-07: 100 mL via INTRAVENOUS

## 2013-12-07 MED ORDER — HYDROCODONE-ACETAMINOPHEN 5-325 MG PO TABS: 1.0000 | ORAL_TABLET | ORAL | Status: DC | PRN

## 2013-12-07 MED ORDER — ONDANSETRON HCL 4 MG PO TABS: 4.0000 mg | ORAL_TABLET | Freq: Four times a day (QID) | ORAL | Status: DC | PRN

## 2013-12-07 MED ORDER — MEGESTROL ACETATE 400 MG/10ML PO SUSP
ORAL | Status: AC
  Filled 2013-12-07: qty 10

## 2013-12-07 MED ORDER — ZOLPIDEM TARTRATE 5 MG PO TABS
5.0000 mg | ORAL_TABLET | Freq: Every evening | ORAL | Status: DC | PRN
  Administered 2013-12-08 – 2013-12-14 (×7): 5 mg via ORAL
  Filled 2013-12-07 (×7): qty 1

## 2013-12-07 MED ORDER — CARVEDILOL 3.125 MG PO TABS
3.1250 mg | ORAL_TABLET | Freq: Two times a day (BID) | ORAL | Status: DC
  Administered 2013-12-07 – 2013-12-12 (×8): 3.125 mg via ORAL
  Filled 2013-12-07 (×13): qty 1

## 2013-12-07 MED ORDER — PNEUMOCOCCAL VAC POLYVALENT 25 MCG/0.5ML IJ INJ
0.5000 mL | INJECTION | INTRAMUSCULAR | Status: AC
Start: 2013-12-08 — End: 2013-12-08
  Administered 2013-12-08: 0.5 mL via INTRAMUSCULAR
  Filled 2013-12-07: qty 0.5

## 2013-12-07 NOTE — ED Provider Notes (Signed)
CSN: [id number]     Arrival date & time 12/07/13  1113 History   First MD Initiated Contact with Patient 12/07/13 1129     Chief Complaint  Patient presents with  . Shortness of Breath   (Consider location/radiation/quality/duration/timing/severity/associated sxs/prior Treatment) HPI Comments: 79 year old male presents with one week of progressive shortness of breath. The shortness of breath seems to worse at night according to the daughter. Daughter states the wife has noted that he is breathing hard and fast at night. His doctor is put him on a Z-Pak which he has 2 days left bases beginning worse during this time. No fevers or chills during this time. He has developed a "mild cough and has had some mild rhinorrhea. Denies any muscle aches or sore throat or headache. His daughter states he was complaining of some chest pain when coughing today. Shortness of breath has gotten worse so he presented to the ER. Denies any leg swelling or leg pain. Does not have any history of similar symptoms.   Past Medical History  Diagnosis Date  . Hypertension   . Hypercholesteremia   . Coronary artery disease   . Myocardial infarction   . Chronic back pain   . Peripheral vascular disease   . GERD (gastroesophageal reflux disease)    Past Surgical History  Procedure Laterality Date  . Coronary artery bypass graft    . Cholecystectomy    . Hernia repair      X 3  . Back surgery    . Colonoscopy  03/31/2012    Procedure: COLONOSCOPY;  Surgeon: Malissa Hippo, MD;  Location: [facility];  Service: Endoscopy;  Laterality: N/A;  1030  . Esophagogastroduodenoscopy N/A 07/05/2013    Procedure: ESOPHAGOGASTRODUODENOSCOPY (EGD);  Surgeon: Malissa Hippo, MD;  Location: [facility];  Service: Endoscopy;  Laterality: N/A;  200-moved to 12:00pm Ann to notify pt   Family History  Problem Relation Age of Onset  . Colon cancer Sister   . Pancreatic cancer Paternal Uncle    History  Substance Use Topics   . Smoking status: Former Smoker -- 1.00 packs/day for 15 years    Types: Cigarettes  . Smokeless tobacco: Never Used     Comment: quit 1987  . Alcohol Use: No    Review of Systems  Constitutional: Negative for fever.  HENT: Positive for congestion.   Respiratory: Positive for cough and shortness of breath.   Cardiovascular: Positive for chest pain (started having midline chest pain after coughing this AM). Negative for leg swelling.  Neurological: Positive for weakness.  All other systems reviewed and are negative.    Allergies  Review of patient's allergies indicates no known allergies.  Home Medications   Current Outpatient Rx  Name  Route  Sig  Dispense  Refill  . ALPRAZolam (XANAX) 0.5 MG tablet   Oral   Take 1 tablet by mouth 3 (three) times daily.         . ciprofloxacin (CIPRO) 500 MG tablet   Oral   Take 1 tablet (500 mg total) by mouth 2 (two) times daily.   20 tablet   0   . citalopram (CELEXA) 20 MG tablet   Oral   Take 1 tablet (20 mg total) by mouth daily.   30 tablet   5   . esomeprazole (NEXIUM) 40 MG capsule   Oral   Take 40 mg by mouth daily.         Marland Kitchen HYDROcodone-acetaminophen (LORCET) 10-650 MG  per tablet   Oral   Take 0.05-1 tablets by mouth 2 (two) times daily as needed. Pain         . lisinopril-hydrochlorothiazide (PRINZIDE,ZESTORETIC) 10-12.5 MG per tablet   Oral   Take 1 tablet by mouth daily.         . megestrol (MEGACE) 40 MG/ML suspension   Oral   Take 5 mLs (200 mg total) by mouth daily.   240 mL   2   . ondansetron (ZOFRAN-ODT) 4 MG disintegrating tablet   Oral   Take 1 tablet (4 mg total) by mouth daily before breakfast.   30 tablet   0   . simvastatin (ZOCOR) 40 MG tablet   Oral   Take 40 mg by mouth daily.         Marland Kitchen zolpidem (AMBIEN) 10 MG tablet   Oral   Take 1 tablet by mouth at bedtime as needed for sleep.           BP 123/83  Pulse 112  Temp(Src) 98.7 F (37.1 C) (Oral)  Resp 28  Ht 5\' 7"   (1.702 m)  Wt 148 lb (67.132 kg)  BMI 23.17 kg/m2  SpO2 99% Physical Exam  Nursing note and vitals reviewed. Constitutional: He is oriented to person, place, and time. He appears well-developed and well-nourished.  HENT:  Head: Normocephalic and atraumatic.  Right Ear: External ear normal.  Left Ear: External ear normal.  Nose: Nose normal.  Eyes: Right eye exhibits no discharge. Left eye exhibits no discharge.  Neck: Neck supple.  Cardiovascular: Regular rhythm, normal heart sounds and intact distal pulses.  Tachycardia present.   Pulmonary/Chest: No accessory muscle usage. Tachypnea noted. He has rales in the right lower field and the left lower field.  Able to speak in complete sentences but appears mildly tachypneic.  Abdominal: Soft. There is no tenderness.  Musculoskeletal: He exhibits no edema and no tenderness.  Neurological: He is alert and oriented to person, place, and time.  Skin: Skin is warm and dry.    ED Course  Procedures (including critical care time) Labs Review Labs Reviewed  BASIC METABOLIC PANEL - Abnormal; Notable for the following:    Glucose, Bld 205 (*)    BUN 26 (*)    GFR calc non Af Amer 55 (*)    GFR calc Af Amer 63 (*)    All other components within normal limits  LACTIC ACID, PLASMA - Abnormal; Notable for the following:    Lactic Acid, Venous 3.1 (*)    All other components within normal limits  PRO B NATRIURETIC PEPTIDE - Abnormal; Notable for the following:    Pro B Natriuretic peptide (BNP) 21216.0 (*)    All other components within normal limits  CULTURE, EXPECTORATED SPUTUM-ASSESSMENT  CBC  TROPONIN I  INFLUENZA PANEL BY PCR   Imaging Review Dg Chest 1 View  12/07/2013   CLINICAL DATA:  Shortness of Breath  EXAM: CHEST - 1 VIEW  COMPARISON:  May 29, 2010  FINDINGS: There is cardiomegaly with diffuse interstitial edema. There is no appreciable airspace consolidation. There is pulmonary venous hypertension. There is a degree of  underlying emphysema. Patient is status post coronary artery bypass grafting. No adenopathy.  IMPRESSION: Congestive heart failure superimposed on emphysematous change. No airspace consolidation.   Electronically Signed   By: Bretta Bang M.D.   On: 12/07/2013 12:22   Ct Angio Chest Pe W/cm &/or Wo Cm  12/07/2013   CLINICAL DATA:  One-week  history of dyspnea and none were and nonproductive cough unresponsive to antibiotics, history of previous MI  EXAM: CT ANGIOGRAPHY CHEST WITH CONTRAST  TECHNIQUE: Multidetector CT imaging of the chest was performed using the standard protocol during bolus administration of intravenous contrast. Multiplanar CT image reconstructions including MIPs were obtained to evaluate the vascular anatomy.  CONTRAST:  OMNIPAQUE IOHEXOL 350 MG/ML SOLN  COMPARISON:  Chest x-ray 07 December 2013.  FINDINGS: Contrast within the pulmonary arterial tree is normal in appearance. There are no filling defects to suggest an acute pulmonary embolism. Review of the MIP images confirms the above findings. The cardiac chambers are enlarged. The caliber of the thoracic aorta is normal. Soft tissue fullness in the right hilum on images 42 through 46 suggests mildly enlarged lymph nodes or group of nodes. There is no similar finding on the left. There is no definite sub carinal lymphadenopathy. There are a few normal-sized to mildly enlarged AP window lymph nodes. The retrosternal soft tissues appear normal. There is no evidence of a pleural nor pericardial effusion.  At lung window settings are emphysematous changes diffusely. Numerous blebs are present in the upper lobes. Increased interstitial markings are present within both lungs especially in the lower lobes. This may reflect acute interstitial pneumonia or edema superimposed upon emphysematous change and fibrosis.  Within the upper abdomen the observed portions of the liver and spleen and adrenal glands exhibit no acute abnormalities.  There is mild loss of height of the body of T8. There is degenerative disc change at T8-T9 and at T10-T11. The patient has undergone previous median sternotomy.  IMPRESSION: 1. There is no evidence of an acute pulmonary embolism. 2. The cardiac chambers are enlarged. There is no pleural nor pericardial effusion. There may be mild pulmonary interstitial edema. 3. There is severe emphysematous change in both lungs. Basilar fibrotic changes are present bilaterally. In the upper lung zones subpleural fibrotic changes with numerous bullous lesions are demonstrated as well. There is no evidence of a pneumothorax. 4. There is mild loss of height of the body of T8 without evidence of retropulsion of bone.   Electronically Signed   By: David  [place]   On: 12/07/2013 13:41    EKG Interpretation    Date/Time:  Thursday December 07 2013 11:19:10 EST Ventricular Rate:  129 PR Interval:  200 QRS Duration: 154 QT Interval:  314 QTC Calculation: 460 R Axis:   118 Text Interpretation:  Sinus tachycardia Right bundle branch block Left posterior fascicular block ** Bifascicular block ** T wave abnormality, consider inferior ischemia Abnormal ECG When compared with ECG of 15-Oct-1999 07:17, Vent. rate has increased BY  51 BPM (RBBB and left posterior fascicular block) is now Present Criteria for Inferior infarct are no longer Present Confirmed by Keniya Schlotterbeck  MD, Ben Sanz ([id number]) on 12/07/2013 1:54:21 PM            MDM   1. SOB (shortness of breath)   2. Coronary artery disease   3. Essential hypertension, benign   4. GERD (gastroesophageal reflux disease)    No fever, elevated WBC or productive cough to suggest PNA. Given his tachycardia and no obvious peripheral edema he was given a small bolus of fluids, then his CXR and BNP came back suggesting CHF/pulmonary edema. Will hold further fluids. No change in his respiratory status. Given no anginal sx or obvious cause for pulmonary edema, a CT was obtained to r/o  PE as a cause and is negative. No focal PNA. Will  test for flu and admit to hospitalist given he remains tachypneic and will need further workup.     Audree Camel, MD 12/07/13 [phone]

## 2013-12-07 NOTE — H&P (Addendum)
[facility]                                                                                    Patient Demographics  Taylor Goodman, is a 79 y.o. male  MRN: [id number]   DOB - 1932/01/25  Admit Date - 12/07/2013  Outpatient Primary MD for the patient is [name], MD   With History of -  Past Medical History  Diagnosis Date  . Hypertension   . Hypercholesteremia   . Coronary artery disease   . Myocardial infarction   . Chronic back pain   . Peripheral vascular disease   . GERD (gastroesophageal reflux disease)       Past Surgical History  Procedure Laterality Date  . Coronary artery bypass graft    . Cholecystectomy    . Hernia repair      X 3  . Back surgery    . Colonoscopy  03/31/2012    Procedure: COLONOSCOPY;  Surgeon: Malissa Hippo, MD;  Location: [facility];  Service: Endoscopy;  Laterality: N/A;  1030  . Esophagogastroduodenoscopy N/A 07/05/2013    Procedure: ESOPHAGOGASTRODUODENOSCOPY (EGD);  Surgeon: Malissa Hippo, MD;  Location: [facility];  Service: Endoscopy;  Laterality: N/A;  200-moved to 12:00pm Ann to notify pt    in for   Chief Complaint  Patient presents with  . Shortness of Breath     HPI  Taylor Goodman  is a 79 y.o. male, history of CAD status post CABG over 10 years ago, dyslipidemia, hypertension, GERD, chronic low back pain, who has had a dry cough, gradually progressive shortness of breath, shortness of breath worse with exertion and laying flat, soft his PCP and was placed on azithromycin 6 days ago without much benefit, no fever chills, no palpitations or chest pain, no body aches, no exposure to sick contacts, he did take a flu shot, no recent travel, no personal or family history of DVT or PE. Came to the ER for shortness of breath where workup was consistent with CHF and I was called to admit the patient.    Review of Systems    In addition to the HPI above,   No Fever-chills, No Headache, No changes with Vision  or hearing, No problems swallowing food or Liquids, No Chest pain, positive dry cough, positive orthopnea, positive Shortness of Breath worse with exertion or lying flat also associated with mild dry cough No Abdominal pain, No Nausea or Vommitting, Bowel movements are regular, No Blood in stool or Urine, No dysuria, No new skin rashes or bruises, No new joints pains-aches,  No new weakness, tingling, numbness in any extremity, No recent weight gain or loss, No polyuria, polydypsia or polyphagia, No significant Mental Stressors.  A full 10 point Review of Systems was done, except as stated above, all other Review of Systems were negative.   Social History History  Substance Use Topics  . Smoking status: Former Smoker -- 1.00 packs/day for 15 years    Types: Cigarettes  . Smokeless tobacco: Never Used     Comment: quit 1987  . Alcohol Use: No      Family History Family  History  Problem Relation Age of Onset  . Colon cancer Sister   . Pancreatic cancer Paternal Uncle        Prior to Admission medications   Medication Sig Start Date End Date Taking? Authorizing Provider  ALPRAZolam Prudy Feeler) 0.5 MG tablet Take 1 tablet by mouth 3 (three) times daily. 04/26/13   Historical Provider, MD  ciprofloxacin (CIPRO) 500 MG tablet Take 1 tablet (500 mg total) by mouth 2 (two) times daily. 08/11/13   Malissa Hippo, MD  citalopram (CELEXA) 20 MG tablet Take 1 tablet (20 mg total) by mouth daily. 08/07/13   Malissa Hippo, MD  esomeprazole (NEXIUM) 40 MG capsule Take 40 mg by mouth daily.    Historical Provider, MD  HYDROcodone-acetaminophen (LORCET) 10-650 MG per tablet Take 0.05-1 tablets by mouth 2 (two) times daily as needed. Pain    Historical Provider, MD  lisinopril-hydrochlorothiazide (PRINZIDE,ZESTORETIC) 10-12.5 MG per tablet Take 1 tablet by mouth daily.    Historical Provider, MD  megestrol (MEGACE) 40 MG/ML suspension Take 5 mLs (200 mg total) by mouth daily. 08/07/13   Malissa Hippo, MD  ondansetron (ZOFRAN-ODT) 4 MG disintegrating tablet Take 1 tablet (4 mg total) by mouth daily before breakfast. 07/05/13   Malissa Hippo, MD  simvastatin (ZOCOR) 40 MG tablet Take 40 mg by mouth daily.    Historical Provider, MD  zolpidem (AMBIEN) 10 MG tablet Take 1 tablet by mouth at bedtime as needed for sleep.  04/04/13   Historical Provider, MD    No Known Allergies  Physical Exam  Vitals  Blood pressure 107/71, pulse 125, temperature 98.7 F (37.1 C), temperature source Oral, resp. rate 24, height 5\' 7"  (1.702 m), weight 67.132 kg (148 lb), SpO2 94.00%.   1. General elderly white male lying in bed in NAD,     2. Normal affect and insight, Not Suicidal or Homicidal, Awake Alert, Oriented X 3.  3. No F.N deficits, ALL C.Nerves Intact, Strength 5/5 all 4 extremities, Sensation intact all 4 extremities, Plantars down going.  4. Ears and Eyes appear Normal, Conjunctivae clear, PERRLA. Moist Oral Mucosa.  5. Supple Neck, elevated JVD, No cervical lymphadenopathy appriciated, No Carotid Bruits.  6. Symmetrical Chest wall movement, Good air movement bilaterally, bilateral crackles.  7. RRR, No Gallops, Rubs or Murmurs, No Parasternal Heave.  8. Positive Bowel Sounds, Abdomen Soft, Non tender, No organomegaly appriciated,No rebound -guarding or rigidity.  9.  No Cyanosis, Normal Skin Turgor, No Skin Rash or Bruise.  10. Good muscle tone,  joints appear normal , no effusions, Normal ROM.  11. No Palpable Lymph Nodes in Neck or Axillae     Data Review  CBC  Recent Labs Lab 12/07/13 1138  WBC 8.3  HGB 14.3  HCT 43.4  PLT 191  MCV 92.1  MCH 30.4  MCHC 32.9  RDW 14.9   ------------------------------------------------------------------------------------------------------------------  Chemistries   Recent Labs Lab 12/07/13 1138  NA 139  K 4.3  CL 101  CO2 21  GLUCOSE 205*  BUN 26*  CREATININE 1.22  CALCIUM 9.5    ------------------------------------------------------------------------------------------------------------------ estimated creatinine clearance is 45.9 ml/min (by C-G formula based on Cr of 1.22). ------------------------------------------------------------------------------------------------------------------ No results found for this basename: TSH, T4TOTAL, FREET3, T3FREE, THYROIDAB,  in the last 72 hours   Coagulation profile No results found for this basename: INR, PROTIME,  in the last 168 hours ------------------------------------------------------------------------------------------------------------------- No results found for this basename: DDIMER,  in the last 72 hours -------------------------------------------------------------------------------------------------------------------  Cardiac Enzymes  Recent Labs Lab 12/07/13 1138  TROPONINI <0.30   ------------------------------------------------------------------------------------------------------------------ No components found with this basename: POCBNP,    ---------------------------------------------------------------------------------------------------------------  Urinalysis    Component Value Date/Time   COLORURINE YELLOW 08/07/2013 1034   APPEARANCEUR CLOUDY* 08/07/2013 1034   LABSPEC 1.016 08/07/2013 1034   PHURINE 6.0 08/07/2013 1034   GLUCOSEU NEG 08/07/2013 1034   HGBUR SMALL* 08/07/2013 1034   BILIRUBINUR NEG 08/07/2013 1034   KETONESUR NEG 08/07/2013 1034   PROTEINUR NEG 08/07/2013 1034   UROBILINOGEN 0.2 08/07/2013 1034   NITRITE POS* 08/07/2013 1034   LEUKOCYTESUR LARGE* 08/07/2013 1034    ----------------------------------------------------------------------------------------------------------------  Imaging results:   Dg Chest 1 View  12/07/2013   CLINICAL DATA:  Shortness of Breath  EXAM: CHEST - 1 VIEW  COMPARISON:  May 29, 2010  FINDINGS: There is cardiomegaly with diffuse interstitial  edema. There is no appreciable airspace consolidation. There is pulmonary venous hypertension. There is a degree of underlying emphysema. Patient is status post coronary artery bypass grafting. No adenopathy.  IMPRESSION: Congestive heart failure superimposed on emphysematous change. No airspace consolidation.   Electronically Signed   By: Bretta Bang M.D.   On: 12/07/2013 12:22   Ct Angio Chest Pe W/cm &/or Wo Cm  12/07/2013   CLINICAL DATA:  One-week history of dyspnea and none were and nonproductive cough unresponsive to antibiotics, history of previous MI  EXAM: CT ANGIOGRAPHY CHEST WITH CONTRAST  TECHNIQUE: Multidetector CT imaging of the chest was performed using the standard protocol during bolus administration of intravenous contrast. Multiplanar CT image reconstructions including MIPs were obtained to evaluate the vascular anatomy.  CONTRAST:  OMNIPAQUE IOHEXOL 350 MG/ML SOLN  COMPARISON:  Chest x-ray 07 December 2013.  FINDINGS: Contrast within the pulmonary arterial tree is normal in appearance. There are no filling defects to suggest an acute pulmonary embolism. Review of the MIP images confirms the above findings. The cardiac chambers are enlarged. The caliber of the thoracic aorta is normal. Soft tissue fullness in the right hilum on images 42 through 46 suggests mildly enlarged lymph nodes or group of nodes. There is no similar finding on the left. There is no definite sub carinal lymphadenopathy. There are a few normal-sized to mildly enlarged AP window lymph nodes. The retrosternal soft tissues appear normal. There is no evidence of a pleural nor pericardial effusion.  At lung window settings are emphysematous changes diffusely. Numerous blebs are present in the upper lobes. Increased interstitial markings are present within both lungs especially in the lower lobes. This may reflect acute interstitial pneumonia or edema superimposed upon emphysematous change and fibrosis.  Within the  upper abdomen the observed portions of the liver and spleen and adrenal glands exhibit no acute abnormalities. There is mild loss of height of the body of T8. There is degenerative disc change at T8-T9 and at T10-T11. The patient has undergone previous median sternotomy.  IMPRESSION: 1. There is no evidence of an acute pulmonary embolism. 2. The cardiac chambers are enlarged. There is no pleural nor pericardial effusion. There may be mild pulmonary interstitial edema. 3. There is severe emphysematous change in both lungs. Basilar fibrotic changes are present bilaterally. In the upper lung zones subpleural fibrotic changes with numerous bullous lesions are demonstrated as well. There is no evidence of a pneumothorax. 4. There is mild loss of height of the body of T8 without evidence of retropulsion of bone.   Electronically Signed   By: David  [place]   On: 12/07/2013 13:41  My personal review of EKG: Rhythm S.Tach, Bifascicular block    Assessment & Plan   1. Acute respiratory failure secondary to acute on chronic nonspecific CHF. No previous echo and file, he will be admitted on telemetry bed, will place him on Lasix, nitro paste, low-dose Coreg, salt fluid restriction, elevate head of the bed, cycle troponins and obtain echo.     2. CAD. No chest pain, first set of troponin is negative, place him on aspirin, low-dose beta blocker, cycle troponins.     3. Dyslipidemia continue home dose statin     4. Essential hypertension. Blood pressure low, no documented history of reduced EF, he was on ACE/diuretic combination at home which I will hold as blood pressures are low and I want to provide him in a room for nitro paste and beta blocker for now.     5. Chronic low back pain. Supportive care no acute issues.     6. Random sugar of over 200. No documented history of diabetes mellitus, will check A1c, for now low-dose sliding scale insulin with meals.      DVT Prophylaxis Heparin     AM Labs Ordered, also please review Full Orders  Family Communication: Admission, patients condition and plan of care including tests being ordered have been discussed with the patient and daughter who indicate understanding and agree with the plan and Code Status.  Code Status DNR  Likely DC to  Home  Condition GUARDED   Time spent in minutes : 35    Kenly Henckel K M.D on 12/07/2013 at 2:03 PM  Between 7am to 7pm - Pager - [phone]  After 7pm go to [email or web address] - password [email or web address]  And look for the night coverage person covering me after hours  [facility] Office  [phone]

## 2013-12-07 NOTE — ED Notes (Signed)
SOB x 1 week. Azithromycin called in by PMD but pt is no better. Pt states non productive cough just began. SOB is worse at night.

## 2013-12-08 DIAGNOSIS — I059 Rheumatic mitral valve disease, unspecified: Secondary | ICD-10-CM

## 2013-12-08 LAB — BASIC METABOLIC PANEL
BUN: 27 mg/dL — ABNORMAL HIGH (ref 6–23)
CO2: 26 mEq/L (ref 19–32)
Calcium: 9.1 mg/dL (ref 8.4–10.5)
GFR calc non Af Amer: 47 mL/min — ABNORMAL LOW (ref >90)
Glucose, Bld: 127 mg/dL — ABNORMAL HIGH (ref 70–99)
Sodium: 140 mEq/L (ref 135–145)

## 2013-12-08 LAB — CBC
MCH: 30.1 pg (ref 26.0–34.0)
MCHC: 32.8 g/dL (ref 30.0–36.0)
MCV: 91.8 fL (ref 78.0–100.0)
Platelets: 166 10*3/uL (ref 150–400)
RBC: 4.38 MIL/uL (ref 4.22–5.81)
WBC: 7.5 10*3/uL (ref 4.0–10.5)

## 2013-12-08 LAB — GLUCOSE, CAPILLARY
Glucose-Capillary: 182 mg/dL — ABNORMAL HIGH (ref 70–99)
Glucose-Capillary: 145 mg/dL — ABNORMAL HIGH (ref 70–99)
Glucose-Capillary: 134 mg/dL — ABNORMAL HIGH (ref 70–99)

## 2013-12-08 LAB — TROPONIN I: Troponin I: <0.3 ng/mL (ref <0.30)

## 2013-12-08 LAB — HEMOGLOBIN A1C: Hgb A1c MFr Bld: 6.4 % — ABNORMAL HIGH (ref <5.7)

## 2013-12-08 MED ORDER — FUROSEMIDE 40 MG PO TABS
40.0000 mg | ORAL_TABLET | Freq: Once | ORAL | Status: AC
  Administered 2013-12-08: 40 mg via ORAL
  Filled 2013-12-08: qty 1

## 2013-12-08 MED ORDER — LIVING WELL WITH DIABETES BOOK
Freq: Once | Status: AC
  Administered 2013-12-08: 12:00:00
  Filled 2013-12-08: qty 1

## 2013-12-08 NOTE — Progress Notes (Signed)
*  PRELIMINARY RESULTS* Echocardiogram 2D Echocardiogram has been performed.  Taylor Goodman 12/08/2013, 9:55 AM

## 2013-12-08 NOTE — Plan of Care (Signed)
Problem: Food- and Nutrition-Related Knowledge Deficit (NB-1.1) Goal: Nutrition education Formal process to instruct or train a patient/client in a skill or to impart knowledge to help patients/clients voluntarily manage or modify food choices and eating behavior to maintain or improve health. Outcome: Adequate for Discharge  RD consulted for nutrition education regarding pre-diabetes.     Lab Results  Component Value Date    HGBA1C 6.4* 12/07/2013   Pt reports eating a balanced diet of 2 meals per day, usually skipping lunch, due to habit. Breakfast consists of either oatmeal or cereal (corn flakes or bran flakes ("cereal with nothing in it")) and dinner usually consists of a meat, starch, and vegetable or combination foods, such as spaghetti or lasagna. Pt admits to eating large portions of pasta. He drinks mainly Powerade, Gatorade, and juice. He reports he "gave up sodas years ago". He admits to occasionally eating fried seafood. He prepares most of his food at home. 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  RD provided "Carbohydrate Counting for People with Diabetes" handout from the Academy of Nutrition and Dietetics. Discussed different food groups and their effects on blood sugar, emphasizing carbohydrate-containing foods. Provided list of carbohydrates and recommended serving sizes of common foods.  He is currently watching the diabetes education video series.   Spoke with Eilene Ghazi, diabetes coordinator, around 1200 via incoming phone call re: referral for outpatient diabetes education. Pt has received information on free outpatient diabetes classes at [facility] from this RD during visit and provided list of available class dates and times for January 2015 and first part of February 2015. Encouraged attendance.   Discussed importance of controlled and consistent carbohydrate intake throughout the day. Provided examples of ways to  balance meals/snacks and encouraged intake of high-fiber, whole grain complex carbohydrates. Teach back method used.  Expect fair compliance.  Body mass index is 23.68 kg/(m^2). Pt meets criteria for normal based on current BMI.  Current diet order is Heart Healthy, patient is consuming approximately 100% of meals at this time. Labs and medications reviewed. No further nutrition interventions warranted at this time. RD contact information provided. If additional nutrition issues arise, please re-consult RD.  Taylor Goodman A. Mayford Knife, RD, LDN Pager: [phone]

## 2013-12-08 NOTE — Progress Notes (Signed)
UR chart review completed.  

## 2013-12-08 NOTE — Plan of Care (Signed)
Problem: Phase II Progression Outcomes Goal: Other Phase II Outcomes/Goals Outcome: Completed/Met Date Met:  12/08/13 Pt has viewed videos [phone] and 510 for diabetic teaching video [phone] where unavailable

## 2013-12-08 NOTE — Plan of Care (Signed)
Problem: Phase II Progression Outcomes Goal: Other Phase II Outcomes/Goals Outcome: Progressing Diabetes booklet teaching and videos [phone]

## 2013-12-08 NOTE — Progress Notes (Signed)
[facility]                                                                                Patient Demographics  Taylor Goodman, is a 79 y.o. male, DOB - [birth year], ZOX:[id number]  Admit date - 12/07/2013   Admitting Physician Leroy Sea, MD  Outpatient Primary MD for the patient is [name], MD  LOS - 1   Chief Complaint  Patient presents with  . Shortness of Breath        Assessment & Plan    1. Acute respiratory failure secondary to acute on chronic nonspecific CHF. No previous echo and file, improved with Lasix yesterday, will be continued at a lower dose, he's been stable on telemetry, discontinue nitro paste now as blood pressures are borderline, continue low-dose Coreg, salt fluid restriction, elevate head of the bed, negative cycled troponins, pending Echo.     2. CAD. No chest pain, 3 sets of troponin is negative, place him on aspirin, low-dose beta blocker.    3. Dyslipidemia continue home dose statin     4. Essential hypertension. Stable on Coreg.    5. Chronic low back pain. Supportive care no acute issues.     6. Prediabetic. For now continue on sliding scale insulin, and discharge will place him on low carbohydrate diet and possibly on low-dose Glucophage.   Lab Results  Component Value Date   HGBA1C 6.4* 12/07/2013    CBG (last 3)   Recent Labs  12/07/13 1659 12/07/13 2109 12/08/13 0727  GLUCAP 139* 153* 185*      Code Status: Full  Family Communication: daughter  Disposition Plan: home   Procedures TTE   Consults     Medications  Scheduled Meds: . ALPRAZolam  0.5 mg Oral TID  . aspirin EC  81 mg Oral Daily  . carvedilol  3.125 mg Oral BID WC  . citalopram  20 mg Oral Daily  . furosemide  40 mg Oral Once  . heparin  5,000 Units Subcutaneous Q8H  . insulin aspart  0-9 Units Subcutaneous TID WC  . megestrol  200 mg Oral Daily  . pantoprazole  80 mg Oral Q1200  . pneumococcal 23 valent vaccine   0.5 mL Intramuscular Tomorrow-1000  . sodium chloride  3 mL Intravenous Q12H   Continuous Infusions:  PRN Meds:.albuterol, guaiFENesin-dextromethorphan, HYDROcodone-acetaminophen, ondansetron (ZOFRAN) IV, ondansetron, zolpidem  DVT Prophylaxis    Heparin    Lab Results  Component Value Date   PLT 166 12/08/2013    Antibiotics    Anti-infectives   None          Subjective:   Taylor Goodman today has, No headache, No chest pain, No abdominal pain - No Nausea, No new weakness tingling or numbness, No Cough - improving SOB.    Objective:   Filed Vitals:   12/08/13 0042 12/08/13 0549 12/08/13 0632 12/08/13 0759  BP: 96/60 101/65  107/69  Pulse: 66 94  92  Temp:  97.3 F (36.3 C)    TempSrc:  Oral    Resp:  20    Height:      Weight:   68.584 kg (151 lb 3.2 oz)  SpO2:  94%      Wt Readings from Last 3 Encounters:  12/08/13 68.584 kg (151 lb 3.2 oz)  08/07/13 67.541 kg (148 lb 14.4 oz)  05/04/13 66.996 kg (147 lb 11.2 oz)     Intake/Output Summary (Last 24 hours) at 12/08/13 0830 Last data filed at 12/07/13 2030  Gross per 24 hour  Intake      0 ml  Output    400 ml  Net   -400 ml    Exam Awake Alert, Oriented X 3, No new F.N deficits, Normal affect [place].AT,PERRAL Supple Neck, ++ JVD, No cervical lymphadenopathy appriciated.  Symmetrical Chest wall movement, Good air movement bilaterally, basilar rales RRR,No Gallops,Rubs or new Murmurs, No Parasternal Heave +ve B.Sounds, Abd Soft, Non tender, No organomegaly appriciated, No rebound - guarding or rigidity. No Cyanosis, Clubbing or edema, No new Rash or bruise      Data Review   Micro Results No results found for this or any previous visit (from the past 240 hour(s)).  Radiology Reports Dg Chest 1 View  12/07/2013   CLINICAL DATA:  Shortness of Breath  EXAM: CHEST - 1 VIEW  COMPARISON:  May 29, 2010  FINDINGS: There is cardiomegaly with diffuse interstitial edema. There is no appreciable airspace  consolidation. There is pulmonary venous hypertension. There is a degree of underlying emphysema. Patient is status post coronary artery bypass grafting. No adenopathy.  IMPRESSION: Congestive heart failure superimposed on emphysematous change. No airspace consolidation.   Electronically Signed   By: Bretta Bang M.D.   On: 12/07/2013 12:22   Ct Angio Chest Pe W/cm &/or Wo Cm  12/07/2013   CLINICAL DATA:  One-week history of dyspnea and none were and nonproductive cough unresponsive to antibiotics, history of previous MI  EXAM: CT ANGIOGRAPHY CHEST WITH CONTRAST  TECHNIQUE: Multidetector CT imaging of the chest was performed using the standard protocol during bolus administration of intravenous contrast. Multiplanar CT image reconstructions including MIPs were obtained to evaluate the vascular anatomy.  CONTRAST:  OMNIPAQUE IOHEXOL 350 MG/ML SOLN  COMPARISON:  Chest x-ray 07 December 2013.  FINDINGS: Contrast within the pulmonary arterial tree is normal in appearance. There are no filling defects to suggest an acute pulmonary embolism. Review of the MIP images confirms the above findings. The cardiac chambers are enlarged. The caliber of the thoracic aorta is normal. Soft tissue fullness in the right hilum on images 42 through 46 suggests mildly enlarged lymph nodes or group of nodes. There is no similar finding on the left. There is no definite sub carinal lymphadenopathy. There are a few normal-sized to mildly enlarged AP window lymph nodes. The retrosternal soft tissues appear normal. There is no evidence of a pleural nor pericardial effusion.  At lung window settings are emphysematous changes diffusely. Numerous blebs are present in the upper lobes. Increased interstitial markings are present within both lungs especially in the lower lobes. This may reflect acute interstitial pneumonia or edema superimposed upon emphysematous change and fibrosis.  Within the upper abdomen the observed portions of  the liver and spleen and adrenal glands exhibit no acute abnormalities. There is mild loss of height of the body of T8. There is degenerative disc change at T8-T9 and at T10-T11. The patient has undergone previous median sternotomy.  IMPRESSION: 1. There is no evidence of an acute pulmonary embolism. 2. The cardiac chambers are enlarged. There is no pleural nor pericardial effusion. There may be mild pulmonary interstitial edema. 3. There is severe  emphysematous change in both lungs. Basilar fibrotic changes are present bilaterally. In the upper lung zones subpleural fibrotic changes with numerous bullous lesions are demonstrated as well. There is no evidence of a pneumothorax. 4. There is mild loss of height of the body of T8 without evidence of retropulsion of bone.   Electronically Signed   By: David  [place]   On: 12/07/2013 13:41    CBC  Recent Labs Lab 12/07/13 1138 12/08/13 0321  WBC 8.3 7.5  HGB 14.3 13.2  HCT 43.4 40.2  PLT 191 166  MCV 92.1 91.8  MCH 30.4 30.1  MCHC 32.9 32.8  RDW 14.9 14.9    Chemistries   Recent Labs Lab 12/07/13 1138 12/08/13 0321  NA 139 140  K 4.3 4.1  CL 101 102  CO2 21 26  GLUCOSE 205* 127*  BUN 26* 27*  CREATININE 1.22 1.39*  CALCIUM 9.5 9.1   ------------------------------------------------------------------------------------------------------------------ estimated creatinine clearance is 40.3 ml/min (by C-G formula based on Cr of 1.39). ------------------------------------------------------------------------------------------------------------------  Recent Labs  12/07/13 1533  HGBA1C 6.4*   ------------------------------------------------------------------------------------------------------------------ No results found for this basename: CHOL, HDL, LDLCALC, TRIG, CHOLHDL, LDLDIRECT,  in the last 72 hours ------------------------------------------------------------------------------------------------------------------ No results found  for this basename: TSH, T4TOTAL, FREET3, T3FREE, THYROIDAB,  in the last 72 hours ------------------------------------------------------------------------------------------------------------------ No results found for this basename: VITAMINB12, FOLATE, FERRITIN, TIBC, IRON, RETICCTPCT,  in the last 72 hours  Coagulation profile No results found for this basename: INR, PROTIME,  in the last 168 hours  No results found for this basename: DDIMER,  in the last 72 hours  Cardiac Enzymes  Recent Labs Lab 12/07/13 1533 12/07/13 2051 12/08/13 0321  TROPONINI <0.30 <0.30 <0.30   ------------------------------------------------------------------------------------------------------------------ No components found with this basename: POCBNP,      Time Spent in minutes   35   Kaedon Fanelli K M.D on 12/08/2013 at 8:30 AM  Between 7am to 7pm - Pager - [phone]  After 7pm go to [email or web address] - password [email or web address]  And look for the night coverage person covering for me after hours  [facility] Office  [phone]

## 2013-12-08 NOTE — Progress Notes (Signed)
Pt ambulated in hall He became increasingly SOB with 2l [place] O2

## 2013-12-08 NOTE — Progress Notes (Signed)
Inpatient Diabetes Program Recommendations  AACE/ADA: New Consensus Statement on Inpatient Glycemic Control (2013)  Target Ranges:  Prepandial:   less than 140 mg/dL      Peak postprandial:   less than 180 mg/dL (1-2 hours)      Critically ill patients:  140 - 180 mg/dL     Received referral for this patient.  A1c 6.4%.  Per Dr. Thedore Mins, patient has "pre-diabetes".  Spoke with patient over phone.  Reviewed basic DM concepts and encouraged patient to attend free DM classes at [facility] after d/c.  Patient agreeable to classes.    Have ordered DM booklet and DM videos.  RN to review basic DM information with patient before d/c.  Spoke with Cathie Hoops, RD at [facility].  Jackquline Berlin to see patient today and review carbohydrate modified diet with patient.  Jenifer also plans to give patient schedule of free DM classes at [facility].   Will follow. Ambrose Finland RN, MSN, CDE Diabetes Coordinator Inpatient Diabetes Program Team Pager: [phone] (8a-10p)

## 2013-12-09 ENCOUNTER — Encounter (HOSPITAL_COMMUNITY): Payer: Self-pay

## 2013-12-09 DIAGNOSIS — J811 Chronic pulmonary edema: Secondary | ICD-10-CM

## 2013-12-09 DIAGNOSIS — I509 Heart failure, unspecified: Secondary | ICD-10-CM | POA: Diagnosis present

## 2013-12-09 DIAGNOSIS — I5023 Acute on chronic systolic (congestive) heart failure: Secondary | ICD-10-CM

## 2013-12-09 DIAGNOSIS — I739 Peripheral vascular disease, unspecified: Secondary | ICD-10-CM

## 2013-12-09 HISTORY — DX: Acute on chronic systolic (congestive) heart failure: I50.23

## 2013-12-09 LAB — BASIC METABOLIC PANEL
BUN: 34 mg/dL — ABNORMAL HIGH (ref 6–23)
CO2: 25 mEq/L (ref 19–32)
Calcium: 9 mg/dL (ref 8.4–10.5)
Chloride: 100 mEq/L (ref 96–112)
Creatinine, Ser: 1.43 mg/dL — ABNORMAL HIGH (ref 0.50–1.35)
GFR calc Af Amer: 52 mL/min — ABNORMAL LOW (ref >90)
GFR calc non Af Amer: 45 mL/min — ABNORMAL LOW (ref >90)

## 2013-12-09 LAB — GLUCOSE, CAPILLARY
Glucose-Capillary: 99 mg/dL (ref 70–99)
Glucose-Capillary: 148 mg/dL — ABNORMAL HIGH (ref 70–99)
Glucose-Capillary: 148 mg/dL — ABNORMAL HIGH (ref 70–99)

## 2013-12-09 NOTE — Progress Notes (Signed)
t transferred to 2w via care link  Report called Mindy RN

## 2013-12-09 NOTE — H&P (Addendum)
Chief Complaint: Transfer from [facility] for CHF  HPI: The patient is a 79 y/o male with a history of CAD, status post CABG over 10 years ago x 4 (left internal mammary artery to LAD, saphenous vein graft to first diagonal, saphenous vein graft to OM2, saphenous vein graft to posterior descending). His EF at time of CABG was 30%. He also has PVD, dyslipidemia, hypertension, GERD, and chronic low back pain. He has been seen by Dr. Allyson Sabal in the past, but has not followed up in several years. He initially presented to [facility] with a complaint of worsening SOB and orthopnea. W/u at [facility] revealed CHF. BNP was elevated at 21,216. CXR showed cardiomegaly with diffuse interstitial edema. A 2D echo demonstrated LVEF to be severely depressed at approximately 15 to 20% with diffuse hypokinesis, inferior/posterior akinesis. The cavity size was severely dilated. Wall thickness was increased in a pattern of mild LVH. There was moderate MR. Cardiac enzymes were cycled and were negative x 3. He was given IV Lasix and [facility] and was transferred here to [facility] for further treatment.     Past Medical History  Diagnosis Date  . Hypertension   . Hypercholesteremia   . Coronary artery disease   . Myocardial infarction   . Chronic back pain   . Peripheral vascular disease   . GERD (gastroesophageal reflux disease)     Past Surgical History  Procedure Laterality Date  . Cholecystectomy    . Hernia repair      X 3  . Back surgery    . Colonoscopy  03/31/2012    Procedure: COLONOSCOPY;  Surgeon: Malissa Hippo, MD;  Location: [facility] ENDO [facility];  Service: Endoscopy;  Laterality: N/A;  1030  . Esophagogastroduodenoscopy N/A 07/05/2013    Procedure: ESOPHAGOGASTRODUODENOSCOPY (EGD);  Surgeon: Malissa Hippo, MD;  Location: [facility];  Service: Endoscopy;  Laterality: N/A;  200-moved to 12:00pm Ann to notify pt  . Coronary artery bypass graft      Family History  Problem Relation Age of Onset  . Colon cancer Sister   .  Pancreatic cancer Paternal Uncle    Social History:  reports that he has quit smoking. His smoking use included Cigarettes. He has a 15 pack-year smoking history. He has never used smokeless tobacco. He reports that he does not drink alcohol or use illicit drugs.  Allergies: No Known Allergies  Medications Prior to Admission  Medication Sig Dispense Refill  . amLODipine (NORVASC) 5 MG tablet Take 5 mg by mouth daily.      Marland Kitchen aspirin EC 81 MG tablet Take 81 mg by mouth daily.      Marland Kitchen azithromycin (ZITHROMAX) 250 MG tablet Take 250 mg by mouth daily.      Marland Kitchen omeprazole (PRILOSEC OTC) 20 MG tablet Take 20 mg by mouth daily.      . simvastatin (ZOCOR) 40 MG tablet Take 40 mg by mouth daily.      . citalopram (CELEXA) 20 MG tablet Take 1 tablet (20 mg total) by mouth daily.  30 tablet  5  . HYDROcodone-acetaminophen (LORCET) 10-650 MG per tablet Take 0.05-1 tablets by mouth 2 (two) times daily as needed. Pain      . zolpidem (AMBIEN) 10 MG tablet Take 1 tablet by mouth at bedtime as needed for sleep.         Results for orders placed during the hospital encounter of 12/07/13 (from the past 48 hour(s))  INFLUENZA PANEL BY PCR  Status: None   Collection Time    12/07/13  1:05 PM      Result Value Range   Influenza A By PCR NEGATIVE  NEGATIVE   Influenza B By PCR NEGATIVE  NEGATIVE   H1N1 flu by pcr NOT DETECTED  NOT DETECTED   Comment:            The Xpert Flu assay (FDA approved for     nasal aspirates or washes and     nasopharyngeal swab specimens), is     intended as an aid in the diagnosis of     influenza and should not be used as     a sole basis for treatment.  HEMOGLOBIN A1C     Status: Abnormal   Collection Time    12/07/13  3:33 PM      Result Value Range   Hemoglobin A1C 6.4 (*) <5.7 %   Comment: (NOTE)                                                                               According to the ADA Clinical Practice Recommendations for 2011, when     HbA1c is used as a  screening test:      >=6.5%   Diagnostic of Diabetes Mellitus               (if abnormal result is confirmed)     5.7-6.4%   Increased risk of developing Diabetes Mellitus     References:Diagnosis and Classification of Diabetes Mellitus,Diabetes     Care,2011,34(Suppl 1):S62-S69 and Standards of Medical Care in             Diabetes - 2011,Diabetes Care,2011,34 (Suppl 1):S11-S61.   Mean Plasma Glucose 137 (*) <117 mg/dL   Comment: Performed at [facility]  TROPONIN I     Status: None   Collection Time    12/07/13  3:33 PM      Result Value Range   Troponin I <0.30  <0.30 ng/mL   Comment:            Due to the release kinetics of cTnI,     a negative result within the first hours     of the onset of symptoms does not rule out     myocardial infarction with certainty.     If myocardial infarction is still suspected,     repeat the test at appropriate intervals.  GLUCOSE, CAPILLARY     Status: Abnormal   Collection Time    12/07/13  4:59 PM      Result Value Range   Glucose-Capillary 139 (*) 70 - 99 mg/dL   Comment 1 Notify RN     Comment 2 Documented in Chart    TROPONIN I     Status: None   Collection Time    12/07/13  8:51 PM      Result Value Range   Troponin I <0.30  <0.30 ng/mL   Comment:            Due to the release kinetics of cTnI,     a negative result within the first hours     of the onset of  symptoms does not rule out     myocardial infarction with certainty.     If myocardial infarction is still suspected,     repeat the test at appropriate intervals.  GLUCOSE, CAPILLARY     Status: Abnormal   Collection Time    12/07/13  9:09 PM      Result Value Range   Glucose-Capillary 153 (*) 70 - 99 mg/dL  BASIC METABOLIC PANEL     Status: Abnormal   Collection Time    12/08/13  3:21 AM      Result Value Range   Sodium 140  135 - 145 mEq/L   Potassium 4.1  3.5 - 5.1 mEq/L   Chloride 102  96 - 112 mEq/L   CO2 26  19 - 32 mEq/L   Glucose, Bld 127 (*) 70 -  99 mg/dL   BUN 27 (*) 6 - 23 mg/dL   Creatinine, Ser 1.61 (*) 0.50 - 1.35 mg/dL   Calcium 9.1  8.4 - 09.6 mg/dL   GFR calc non Af Amer 47 (*) >90 mL/min   GFR calc Af Amer 54 (*) >90 mL/min   Comment: (NOTE)     The eGFR has been calculated using the CKD EPI equation.     This calculation has not been validated in all clinical situations.     eGFR's persistently <90 mL/min signify possible Chronic Kidney     Disease.  CBC     Status: None   Collection Time    12/08/13  3:21 AM      Result Value Range   WBC 7.5  4.0 - 10.5 K/uL   RBC 4.38  4.22 - 5.81 MIL/uL   Hemoglobin 13.2  13.0 - 17.0 g/dL   HCT 04.5  40.9 - 81.1 %   MCV 91.8  78.0 - 100.0 fL   MCH 30.1  26.0 - 34.0 pg   MCHC 32.8  30.0 - 36.0 g/dL   RDW 91.4  78.2 - 95.6 %   Platelets 166  150 - 400 K/uL  TROPONIN I     Status: None   Collection Time    12/08/13  3:21 AM      Result Value Range   Troponin I <0.30  <0.30 ng/mL   Comment:            Due to the release kinetics of cTnI,     a negative result within the first hours     of the onset of symptoms does not rule out     myocardial infarction with certainty.     If myocardial infarction is still suspected,     repeat the test at appropriate intervals.  GLUCOSE, CAPILLARY     Status: Abnormal   Collection Time    12/08/13  7:27 AM      Result Value Range   Glucose-Capillary 185 (*) 70 - 99 mg/dL  GLUCOSE, CAPILLARY     Status: Abnormal   Collection Time    12/08/13 11:38 AM      Result Value Range   Glucose-Capillary 182 (*) 70 - 99 mg/dL  GLUCOSE, CAPILLARY     Status: Abnormal   Collection Time    12/08/13  4:16 PM      Result Value Range   Glucose-Capillary 145 (*) 70 - 99 mg/dL  GLUCOSE, CAPILLARY     Status: Abnormal   Collection Time    12/08/13 10:12 PM      Result Value Range  Glucose-Capillary 134 (*) 70 - 99 mg/dL  BASIC METABOLIC PANEL     Status: Abnormal   Collection Time    12/09/13  7:31 AM      Result Value Range   Sodium 137  135  - 145 mEq/L   Potassium 4.2  3.5 - 5.1 mEq/L   Chloride 100  96 - 112 mEq/L   CO2 25  19 - 32 mEq/L   Glucose, Bld 113 (*) 70 - 99 mg/dL   BUN 34 (*) 6 - 23 mg/dL   Creatinine, Ser 5.78 (*) 0.50 - 1.35 mg/dL   Calcium 9.0  8.4 - 46.9 mg/dL   GFR calc non Af Amer 45 (*) >90 mL/min   GFR calc Af Amer 52 (*) >90 mL/min   Comment: (NOTE)     The eGFR has been calculated using the CKD EPI equation.     This calculation has not been validated in all clinical situations.     eGFR's persistently <90 mL/min signify possible Chronic Kidney     Disease.  GLUCOSE, CAPILLARY     Status: None   Collection Time    12/09/13  7:35 AM      Result Value Range   Glucose-Capillary 99  70 - 99 mg/dL  GLUCOSE, CAPILLARY     Status: Abnormal   Collection Time    12/09/13 11:35 AM      Result Value Range   Glucose-Capillary 148 (*) 70 - 99 mg/dL   Comment 1 Notify RN     Comment 2 Documented in Chart     Ct Angio Chest Pe W/cm &/or Wo Cm  12/07/2013   CLINICAL DATA:  One-week history of dyspnea and none were and nonproductive cough unresponsive to antibiotics, history of previous MI  EXAM: CT ANGIOGRAPHY CHEST WITH CONTRAST  TECHNIQUE: Multidetector CT imaging of the chest was performed using the standard protocol during bolus administration of intravenous contrast. Multiplanar CT image reconstructions including MIPs were obtained to evaluate the vascular anatomy.  CONTRAST:  OMNIPAQUE IOHEXOL 350 MG/ML SOLN  COMPARISON:  Chest x-ray 07 December 2013.  FINDINGS: Contrast within the pulmonary arterial tree is normal in appearance. There are no filling defects to suggest an acute pulmonary embolism. Review of the MIP images confirms the above findings. The cardiac chambers are enlarged. The caliber of the thoracic aorta is normal. Soft tissue fullness in the right hilum on images 42 through 46 suggests mildly enlarged lymph nodes or group of nodes. There is no similar finding on the left. There is no  definite sub carinal lymphadenopathy. There are a few normal-sized to mildly enlarged AP window lymph nodes. The retrosternal soft tissues appear normal. There is no evidence of a pleural nor pericardial effusion.  At lung window settings are emphysematous changes diffusely. Numerous blebs are present in the upper lobes. Increased interstitial markings are present within both lungs especially in the lower lobes. This may reflect acute interstitial pneumonia or edema superimposed upon emphysematous change and fibrosis.  Within the upper abdomen the observed portions of the liver and spleen and adrenal glands exhibit no acute abnormalities. There is mild loss of height of the body of T8. There is degenerative disc change at T8-T9 and at T10-T11. The patient has undergone previous median sternotomy.  IMPRESSION: 1. There is no evidence of an acute pulmonary embolism. 2. The cardiac chambers are enlarged. There is no pleural nor pericardial effusion. There may be mild pulmonary interstitial edema. 3. There is severe  emphysematous change in both lungs. Basilar fibrotic changes are present bilaterally. In the upper lung zones subpleural fibrotic changes with numerous bullous lesions are demonstrated as well. There is no evidence of a pneumothorax. 4. There is mild loss of height of the body of T8 without evidence of retropulsion of bone.   Electronically Signed   By: David  [place]   On: 12/07/2013 13:41    Review of Systems  Respiratory: Positive for shortness of breath.   Cardiovascular: Positive for orthopnea. Negative for chest pain.  All other systems reviewed and are negative.    Blood pressure 95/71, pulse 98, temperature 97.8 F (36.6 C), temperature source Oral, resp. rate 20, height 5\' 7"  (1.702 m), weight 68.9 kg (151 lb 14.4 oz), SpO2 97.00%. Physical Exam  Constitutional: He is oriented to person, place, and time. He appears well-developed and well-nourished. No distress.  Neck: No JVD present.  Carotid bruit is not present.  Cardiovascular: Normal rate, regular rhythm and intact distal pulses.  Exam reveals gallop and S3. Exam reveals no friction rub.   No murmur heard. Respiratory: Effort normal and breath sounds normal. No respiratory distress. He has no wheezes. He has no rales.  Musculoskeletal: He exhibits no edema.  Neurological: He is alert and oriented to person, place, and time.  Skin: Skin is warm and dry. He is not diaphoretic.  Psychiatric: He has a normal mood and affect. His behavior is normal.     Assessment/Plan Principal Problem:   Acute on chronic systolic heart failure Active Problems:   GERD (gastroesophageal reflux disease)   High cholesterol   Essential hypertension, benign   Coronary artery disease   PVD (peripheral vascular disease)   SOB (shortness of breath)   Pulmonary edema  Plan: direct transfer [facility] for acute/chronic CHF and severely reduced EF of 15% (30 % on prior study). Lungs are clear. No peripheral edema. Will order 20 mg PO Lasix. Will recheck BNP. Will plan for [facility] on Monday.    Robbie Lis 12/09/2013, 12:23 PM   Agree with note written by Boyce Medici  PAC  ATSP known to me. Last OV approximately 4 years ago and not since due to financial constraints.he has a history of remote coronary artery bypass grafting back in 2000. His ejection fraction was 30% at that time. He also has peripheral vascular disease. He developed dyspnea on exertion and orthopnea possibly one week ago and was admitted [facility]enem hospital. His EF by 2-D echo was 15%. He a palmar edema and elevated BNP. His breathing is improved as the result of diuresis. His serum creatinine is elevated mildly. His exam today is benign with an intermittent S3 and no peripheral edema. Plans will be to perform right and left heart catheterization on Monday to define his anatomy. His EKG shows right bundle branch block, sinus tachycardia. He is on low-dose beta blockers  which will be adjusted.  Runell Gess 12/09/2013 12:43 PM

## 2013-12-09 NOTE — Discharge Summary (Signed)
Patient Demographics  Taylor Goodman   YNW:[id number]  MVH:[id number]  is a 79 y.o. male  DOB - Aug 31, 1932  12/07/2013  Transfer Date 12/09/2013  Admitting Physician Leroy Sea, MD  Outpatient Primary MD for the patient is [name], MD    Place of Transfer [place] Accepting MD Dr. Erlene Quan [facility] Condition stable  Admission Diagnosis  Essential hypertension, benign [401.1] Coronary artery disease [414.00] GERD (gastroesophageal reflux disease) [530.81] SOB (shortness of breath) [786.05]  Discharge Diagnosis    severe acute on chronic combined systolic and diastolic CHF EF 84%  Past Medical History  Diagnosis Date  . Hypertension   . Hypercholesteremia   . Coronary artery disease   . Myocardial infarction   . Chronic back pain   . Peripheral vascular disease   . GERD (gastroesophageal reflux disease)     Past Surgical History  Procedure Laterality Date  . Coronary artery bypass graft    . Cholecystectomy    . Hernia repair      X 3  . Back surgery    . Colonoscopy  03/31/2012    Procedure: COLONOSCOPY;  Surgeon: Malissa Hippo, MD;  [facility];  Service: Endoscopy;  Laterality: N/A;  1030  . Esophagogastroduodenoscopy N/A 07/05/2013    Procedure: ESOPHAGOGASTRODUODENOSCOPY (EGD);  Surgeon: Malissa Hippo, MD;  Location: [facility];  Service: Endoscopy;  Laterality: N/A;  200-moved to 12:00pm Ann to notify pt    Consults  cardiology consulted but not available  Significant Tests   2-D echogram shows EF of about 20% with diffuse hypokinesis and LVH    Dg Chest 1 View  12/07/2013   CLINICAL DATA:  Shortness of Breath  EXAM: CHEST - 1 VIEW  COMPARISON:  May 29, 2010  FINDINGS: There is cardiomegaly with diffuse interstitial edema. There is no appreciable airspace consolidation. There is pulmonary venous hypertension. There is a degree of underlying emphysema. Patient is status post coronary artery bypass  grafting. No adenopathy.  IMPRESSION: Congestive heart failure superimposed on emphysematous change. No airspace consolidation.   Electronically Signed   By: Bretta Bang M.D.   On: 12/07/2013 12:22   Ct Angio Chest Pe W/cm &/or Wo Cm  12/07/2013   CLINICAL DATA:  One-week history of dyspnea and none were and nonproductive cough unresponsive to antibiotics, history of previous MI  EXAM: CT ANGIOGRAPHY CHEST WITH CONTRAST  TECHNIQUE: Multidetector CT imaging of the chest was performed using the standard protocol during bolus administration of intravenous contrast. Multiplanar CT image reconstructions including MIPs were obtained to evaluate the vascular anatomy.  CONTRAST:  OMNIPAQUE IOHEXOL 350 MG/ML SOLN  COMPARISON:  Chest x-ray 07 December 2013.  FINDINGS: Contrast within the pulmonary arterial tree is normal in appearance. There are no filling defects to suggest an acute pulmonary embolism. Review of the MIP images confirms the above findings. The cardiac chambers are enlarged. The caliber of the thoracic aorta is normal. Soft tissue fullness in the right hilum on images 42 through 46 suggests mildly enlarged lymph nodes or group of nodes. There is no similar finding on the left. There is no definite sub carinal lymphadenopathy. There are a few normal-sized to mildly enlarged AP window lymph nodes. The retrosternal soft tissues appear normal. There is no evidence of a pleural nor pericardial effusion.  At lung window settings are emphysematous changes diffusely. Numerous blebs are present in the upper lobes. Increased interstitial markings are present within both lungs especially in the  lower lobes. This may reflect acute interstitial pneumonia or edema superimposed upon emphysematous change and fibrosis.  Within the upper abdomen the observed portions of the liver and spleen and adrenal glands exhibit no acute abnormalities. There is mild loss of height of the body of T8. There is degenerative  disc change at T8-T9 and at T10-T11. The patient has undergone previous median sternotomy.  IMPRESSION: 1. There is no evidence of an acute pulmonary embolism. 2. The cardiac chambers are enlarged. There is no pleural nor pericardial effusion. There may be mild pulmonary interstitial edema. 3. There is severe emphysematous change in both lungs. Basilar fibrotic changes are present bilaterally. In the upper lung zones subpleural fibrotic changes with numerous bullous lesions are demonstrated as well. There is no evidence of a pneumothorax. 4. There is mild loss of height of the body of T8 without evidence of retropulsion of bone.   Electronically Signed   By: David  [place]   On: 12/07/2013 13:41       Hospital Course See H&P for all details in brief, 79 y.o. male, history of CAD status post CABG over 10 years ago, dyslipidemia, hypertension, GERD, chronic low back pain, who has had a dry cough, gradually progressive shortness of breath, shortness of breath worse with exertion and laying flat, soft his PCP and was placed on azithromycin 6 days ago without much benefit, no fever chills, no palpitations or chest pain, no body aches, no exposure to sick contacts, he did take a flu shot, no recent travel, no personal or family history of DVT or PE. Came to the ER for shortness of breath where workup was consistent with CHF and I was called to admit the patient.      He was treated her for acute respiratory failure due to acute on chronic combined systolic and diastolic CHF, he was given Lasix with some relief, his creatinine has, from a baseline of 1.2 to 1.4 which is mild rise in likely consistent with AK I. ruled out for MI with negative troponins, we obtained an echogram which shows an EF of 20% with global hypokinesis and LVH, and currently patient was told that his heart was strong upon his last cardiology visit with his primary cardiologist Dr. Nanetta Batty several years ago. In the light of these  findings I discussed the case with oncologist and Dr. Allyson Sabal who has graciously accepted the patient in transfer for further cardiac workup and management of this CHF. Currently he is been on low-dose aspirin, low-dose beta blocker and Lasix on a daily basis. Requiring oxygen. Blood pressure too low to tolerate nitro paste, ACE./ARB.     For his underlying history of dyslipidemia will continue statin.     Prediabetic. For now continue on sliding scale insulin, and discharge will place him on low carbohydrate diet and possibly on low-dose Glucophage.     Lab Results   Component  Value  Date    HGBA1C  6.4*  12/07/2013     CBG (last 3)   Recent Labs  12/08/13 1616 12/08/13 2212 12/09/13 0735  GLUCAP 145* 134* 99        Today   Subjective:   Taylor Goodman today has no headache,no chest abdominal pain,no new weakness tingling or numbness, has exertional shortness of breath  Objective:   Blood pressure 101/74, pulse 92, temperature 97.8 F (36.6 C), temperature source Oral, resp. rate 20, height 5\' 7"  (1.702 m), weight 69.899 kg (154 lb 1.6 oz), SpO2 92.00%.  Intake/Output Summary (Last 24 hours) at 12/09/13 0840 Last data filed at 12/08/13 2150  Gross per 24 hour  Intake    560 ml  Output    600 ml  Net    -40 ml    Exam Awake Alert, Oriented *3, No new F.N deficits, Normal affect [place].AT,PERRAL Supple Neck,No JVD, No cervical lymphadenopathy appriciated.  Symmetrical Chest wall movement, Good air movement bilaterally, CTAB RRR,No Gallops,Rubs or new Murmurs, No Parasternal Heave +ve B.Sounds, Abd Soft, Non tender, No organomegaly appriciated, No rebound -guarding or rigidity. No Cyanosis, Clubbing or edema, No new Rash or bruise  Data Review  CBC w Diff: Lab Results  Component Value Date   WBC 7.5 12/08/2013   HGB 13.2 12/08/2013   HCT 40.2 12/08/2013   PLT 166 12/08/2013   LYMPHOPCT 33 05/05/2013   MONOPCT 10 05/05/2013   EOSPCT 2 05/05/2013   BASOPCT 0  05/05/2013   CMP: Lab Results  Component Value Date   NA 137 12/09/2013   K 4.2 12/09/2013   CL 100 12/09/2013   CO2 25 12/09/2013   BUN 34* 12/09/2013   CREATININE 1.43* 12/09/2013   CREATININE 0.80 08/07/2013   PROT 7.3 08/07/2013   ALBUMIN 4.4 08/07/2013   BILITOT 0.7 08/07/2013   ALKPHOS 69 08/07/2013   AST 19 08/07/2013   ALT 17 08/07/2013  .  Prior to Admission medications   Medication Sig Start Date End Date Taking? Authorizing Provider  amLODipine (NORVASC) 5 MG tablet Take 5 mg by mouth daily. 11/21/13  Yes Historical Provider, MD  aspirin EC 81 MG tablet Take 81 mg by mouth daily.   Yes Historical Provider, MD  azithromycin (ZITHROMAX) 250 MG tablet Take 250 mg by mouth daily. 12/04/13  Yes Historical Provider, MD  omeprazole (PRILOSEC OTC) 20 MG tablet Take 20 mg by mouth daily.   Yes Historical Provider, MD  simvastatin (ZOCOR) 40 MG tablet Take 40 mg by mouth daily.   Yes Historical Provider, MD  citalopram (CELEXA) 20 MG tablet Take 1 tablet (20 mg total) by mouth daily. 08/07/13   Malissa Hippo, MD  HYDROcodone-acetaminophen (LORCET) 10-650 MG per tablet Take 0.05-1 tablets by mouth 2 (two) times daily as needed. Pain    Historical Provider, MD  zolpidem (AMBIEN) 10 MG tablet Take 1 tablet by mouth at bedtime as needed for sleep.  04/04/13   Historical Provider, MD      . ALPRAZolam  0.5 mg Oral TID  . aspirin EC  81 mg Oral Daily  . carvedilol  3.125 mg Oral BID WC  . citalopram  20 mg Oral Daily  . heparin  5,000 Units Subcutaneous Q8H  . insulin aspart  0-9 Units Subcutaneous TID WC  . megestrol  200 mg Oral Daily  . pantoprazole  80 mg Oral Q1200  . sodium chloride  3 mL Intravenous Q12H     The risks and  benefits including possible death-disability of treating patient at this facility vs transporting the patinet to a higher level of care were discussed in detail with patient  and the plan was acceptable to them.   Total Time in preparing paper work, todays  exam and data evaluation 35 minutes  Leroy Sea M.D on 12/09/2013 at 8:40 AM  [facility] Office  [phone]

## 2013-12-10 LAB — PRO B NATRIURETIC PEPTIDE: Pro B Natriuretic peptide (BNP): 22359 pg/mL — ABNORMAL HIGH (ref 0–450)

## 2013-12-10 LAB — BASIC METABOLIC PANEL
CO2: 23 mEq/L (ref 19–32)
Calcium: 8.9 mg/dL (ref 8.4–10.5)
Creatinine, Ser: 1.28 mg/dL (ref 0.50–1.35)
Glucose, Bld: 125 mg/dL — ABNORMAL HIGH (ref 70–99)

## 2013-12-10 LAB — GLUCOSE, CAPILLARY
Glucose-Capillary: 123 mg/dL — ABNORMAL HIGH (ref 70–99)
Glucose-Capillary: 146 mg/dL — ABNORMAL HIGH (ref 70–99)

## 2013-12-10 MED ORDER — SODIUM CHLORIDE 0.9 % IV SOLN
INTRAVENOUS | Status: DC
  Administered 2013-12-11: 06:00:00 via INTRAVENOUS
  Administered 2013-12-11: 10 mL/h via INTRAVENOUS

## 2013-12-10 MED ORDER — SODIUM CHLORIDE 0.9 % IJ SOLN: 3.0000 mL | INTRAMUSCULAR | Status: DC | PRN

## 2013-12-10 MED ORDER — ASPIRIN 81 MG PO CHEW
81.0000 mg | CHEWABLE_TABLET | ORAL | Status: AC
  Administered 2013-12-11: 81 mg via ORAL
  Filled 2013-12-10: qty 1

## 2013-12-10 MED ORDER — FUROSEMIDE 40 MG PO TABS
40.0000 mg | ORAL_TABLET | Freq: Every day | ORAL | Status: DC
  Administered 2013-12-10: 40 mg via ORAL
  Filled 2013-12-10 (×2): qty 1

## 2013-12-10 MED ORDER — SODIUM CHLORIDE 0.9 % IV SOLN: 250.0000 mL | INTRAVENOUS | Status: DC | PRN

## 2013-12-10 MED ORDER — ASPIRIN EC 81 MG PO TBEC
81.0000 mg | DELAYED_RELEASE_TABLET | Freq: Every day | ORAL | Status: DC
  Administered 2013-12-12 – 2013-12-15 (×4): 81 mg via ORAL
  Filled 2013-12-10 (×4): qty 1

## 2013-12-10 MED ORDER — SODIUM CHLORIDE 0.9 % IJ SOLN
3.0000 mL | Freq: Two times a day (BID) | INTRAMUSCULAR | Status: DC
  Administered 2013-12-10: 3 mL via INTRAVENOUS

## 2013-12-10 NOTE — Progress Notes (Signed)
Subjective: Breathing slightly improved. Continues to sleep with head of bed elevated. Denies CP.  Objective: Vital signs in last 24 hours: Temp:  [97.6 F (36.4 C)-97.8 F (36.6 C)] 97.6 F (36.4 C) (12/28 0419) Pulse Rate:  [64-106] 106 (12/28 0419) Resp:  [20-28] 28 (12/28 0419) BP: (95-113)/(62-74) 110/74 mmHg (12/28 0419) SpO2:  [95 %-97 %] 97 % (12/28 0419) Weight:  [68.9 kg (151 lb 14.4 oz)-70.217 kg (154 lb 12.8 oz)] 70.217 kg (154 lb 12.8 oz) (12/28 0419) Last BM Date: 12/08/13  Intake/Output from previous day: 12/27 0701 - 12/28 0700 In: 360 [P.O.:360] Out: 175 [Urine:175] Intake/Output this shift:    Medications Current Facility-Administered Medications  Medication Dose Route Frequency Provider Last Rate Last Dose  . albuterol (PROVENTIL) (5 MG/ML) 0.5% nebulizer solution 2.5 mg  2.5 mg Nebulization Q2H PRN Leroy Sea, MD      . ALPRAZolam Prudy Feeler) tablet 0.5 mg  0.5 mg Oral TID Leroy Sea, MD   0.5 mg at 12/08/13 1610  . aspirin EC tablet 81 mg  81 mg Oral Daily Leroy Sea, MD   81 mg at 12/09/13 1449  . carvedilol (COREG) tablet 3.125 mg  3.125 mg Oral BID WC Leroy Sea, MD   3.125 mg at 12/09/13 0914  . citalopram (CELEXA) tablet 20 mg  20 mg Oral Daily Leroy Sea, MD   20 mg at 12/09/13 1449  . guaiFENesin-dextromethorphan (ROBITUSSIN DM) 100-10 MG/5ML syrup 5 mL  5 mL Oral Q4H PRN Leroy Sea, MD      . heparin injection 5,000 Units  5,000 Units Subcutaneous Q8H Leroy Sea, MD   5,000 Units at 12/10/13 [phone]  . HYDROcodone-acetaminophen (NORCO/VICODIN) 5-325 MG per tablet 1-2 tablet  1-2 tablet Oral Q4H PRN Leroy Sea, MD      . insulin aspart (novoLOG) injection 0-9 Units  0-9 Units Subcutaneous TID WC Leroy Sea, MD   1 Units at 12/08/13 1701  . megestrol (MEGACE) 400 MG/10ML suspension 200 mg  200 mg Oral Daily Leroy Sea, MD   200 mg at 12/08/13 1034  . ondansetron (ZOFRAN) tablet 4 mg  4 mg Oral  Q6H PRN Leroy Sea, MD       Or  . ondansetron (ZOFRAN) injection 4 mg  4 mg Intravenous Q6H PRN Leroy Sea, MD      . pantoprazole (PROTONIX) EC tablet 80 mg  80 mg Oral Q1200 Leroy Sea, MD   80 mg at 12/09/13 1122  . sodium chloride 0.9 % injection 3 mL  3 mL Intravenous Q12H Leroy Sea, MD   3 mL at 12/08/13 2142  . zolpidem (AMBIEN) tablet 5 mg  5 mg Oral QHS PRN Leroy Sea, MD   5 mg at 12/09/13 2142    PE: General appearance: alert, cooperative and no distress Lungs: faint bibasilar rales L>R Heart: regular rate and rhythm and S3 present Extremities: no LEE Pulses: 2+ and symmetric Skin: warm and dry Neurologic: Grossly normal  Lab Results:   Recent Labs  12/07/13 1138 12/08/13 0321  WBC 8.3 7.5  HGB 14.3 13.2  HCT 43.4 40.2  PLT 191 166   BMET  Recent Labs  12/08/13 0321 12/09/13 0731 12/10/13 0520  NA 140 137 136  K 4.1 4.2 4.0  CL 102 100 101  CO2 26 25 23   GLUCOSE 127* 113* 125*  BUN 27* 34* 39*  CREATININE 1.39* 1.43* 1.28  CALCIUM 9.1 9.0 8.9    Assessment/Plan  Principal Problem:   Acute on chronic systolic heart failure Active Problems:   GERD (gastroesophageal reflux disease)   High cholesterol   Essential hypertension, benign   Coronary artery disease   PVD (peripheral vascular disease)   SOB (shortness of breath)   Pulmonary edema   CHF (congestive heart failure)  Plan:  Mild improvement in breathing. PO diuretic initiated yesterday. BNP pending. No CP. Plan for [facility] in the AM. NPO at midnight.     LOS: 3 days    Brittainy M. Delmer Islam 12/10/2013 7:44 AM  Agree with note written by Boyce Medici  PAC  Still SOB. I/O mildly neg. SCr improved. BNP elevated. Will add PO lasix. Exam benign. For R/L heart cath tomorrow.   Runell Gess 12/10/2013 7:59 AM

## 2013-12-11 ENCOUNTER — Encounter (HOSPITAL_COMMUNITY)
Admission: EM | Disposition: A | Discharge status: Home Health | Payer: Self-pay | Source: Home / Self Care | Attending: Cardiovascular Disease

## 2013-12-11 DIAGNOSIS — I2510 Atherosclerotic heart disease of native coronary artery without angina pectoris: Secondary | ICD-10-CM

## 2013-12-11 HISTORY — PX: LEFT AND RIGHT HEART CATHETERIZATION WITH CORONARY/GRAFT ANGIOGRAM: SHX5448

## 2013-12-11 LAB — CBC
HCT: 39.2 % (ref 39.0–52.0)
Hemoglobin: 13.1 g/dL (ref 13.0–17.0)
MCH: 30.7 pg (ref 26.0–34.0)
MCHC: 33.4 g/dL (ref 30.0–36.0)
MCV: 91.8 fL (ref 78.0–100.0)
RBC: 4.27 MIL/uL (ref 4.22–5.81)
WBC: 8.5 10*3/uL (ref 4.0–10.5)

## 2013-12-11 LAB — BASIC METABOLIC PANEL
Calcium: 9 mg/dL (ref 8.4–10.5)
Chloride: 101 mEq/L (ref 96–112)
GFR calc Af Amer: 56 mL/min — ABNORMAL LOW (ref >90)
GFR calc non Af Amer: 49 mL/min — ABNORMAL LOW (ref >90)
Glucose, Bld: 148 mg/dL — ABNORMAL HIGH (ref 70–99)
Potassium: 4.3 mEq/L (ref 3.5–5.1)
Sodium: 139 mEq/L (ref 135–145)

## 2013-12-11 LAB — GLUCOSE, CAPILLARY
Glucose-Capillary: 122 mg/dL — ABNORMAL HIGH (ref 70–99)
Glucose-Capillary: 102 mg/dL — ABNORMAL HIGH (ref 70–99)

## 2013-12-11 LAB — POCT I-STAT 3, VENOUS BLOOD GAS (G3P V)
Acid-base deficit: 1 mmol/L (ref 0.0–2.0)
Bicarbonate: 24.8 mEq/L — ABNORMAL HIGH (ref 20.0–24.0)
TCO2: 26 mmol/L (ref 0–100)
pH, Ven: 7.376 — ABNORMAL HIGH (ref 7.250–7.300)
pO2, Ven: 24 mmHg — CL (ref 30.0–45.0)

## 2013-12-11 LAB — POCT I-STAT 3, ART BLOOD GAS (G3+)
O2 Saturation: 93 %
TCO2: 22 mmol/L (ref 0–100)
pCO2 arterial: 31.4 mmHg — ABNORMAL LOW (ref 35.0–45.0)
pO2, Arterial: 63 mmHg — ABNORMAL LOW (ref 80.0–100.0)

## 2013-12-11 SURGERY — LEFT AND RIGHT HEART CATHETERIZATION WITH CORONARY/GRAFT ANGIOGRAM
Anesthesia: LOCAL

## 2013-12-11 MED ORDER — NITROGLYCERIN 0.2 MG/ML ON CALL CATH LAB
INTRAVENOUS | Status: AC
  Filled 2013-12-11: qty 1

## 2013-12-11 MED ORDER — SODIUM CHLORIDE 0.9 % IJ SOLN
3.0000 mL | Freq: Two times a day (BID) | INTRAMUSCULAR | Status: DC
  Administered 2013-12-12 – 2013-12-14 (×6): 3 mL via INTRAVENOUS

## 2013-12-11 MED ORDER — FUROSEMIDE 10 MG/ML IJ SOLN
40.0000 mg | Freq: Once | INTRAMUSCULAR | Status: AC
  Administered 2013-12-11: 40 mg via INTRAVENOUS
  Filled 2013-12-11: qty 4

## 2013-12-11 MED ORDER — NITROGLYCERIN 2 % TD OINT
0.5000 [in_us] | TOPICAL_OINTMENT | Freq: Once | TRANSDERMAL | Status: AC
  Administered 2013-12-11: 0.5 [in_us] via TOPICAL
  Filled 2013-12-11: qty 30

## 2013-12-11 MED ORDER — SODIUM CHLORIDE 0.9 % IV SOLN
1.0000 mL/kg/h | INTRAVENOUS | Status: AC
  Administered 2013-12-11: 1 mL/kg/h via INTRAVENOUS

## 2013-12-11 MED ORDER — MIDAZOLAM HCL 2 MG/2ML IJ SOLN
INTRAMUSCULAR | Status: AC
  Filled 2013-12-11: qty 2

## 2013-12-11 MED ORDER — SODIUM CHLORIDE 0.9 % IV SOLN: 250.0000 mL | INTRAVENOUS | Status: DC | PRN

## 2013-12-11 MED ORDER — SODIUM CHLORIDE 0.9 % IJ SOLN: 3.0000 mL | INTRAMUSCULAR | Status: DC | PRN

## 2013-12-11 MED ORDER — ALBUTEROL SULFATE (2.5 MG/3ML) 0.083% IN NEBU
2.5000 mg | INHALATION_SOLUTION | RESPIRATORY_TRACT | Status: DC | PRN
  Administered 2013-12-11: 2.5 mg via RESPIRATORY_TRACT
  Filled 2013-12-11 (×2): qty 3

## 2013-12-11 MED ORDER — ONDANSETRON HCL 4 MG/2ML IJ SOLN: 4.0000 mg | Freq: Four times a day (QID) | INTRAMUSCULAR | Status: DC | PRN

## 2013-12-11 MED ORDER — ACETAMINOPHEN 325 MG PO TABS: 650.0000 mg | ORAL_TABLET | ORAL | Status: DC | PRN

## 2013-12-11 MED ORDER — LIDOCAINE HCL (PF) 1 % IJ SOLN
INTRAMUSCULAR | Status: AC
  Filled 2013-12-11: qty 30

## 2013-12-11 MED ORDER — FUROSEMIDE 10 MG/ML IJ SOLN
40.0000 mg | Freq: Two times a day (BID) | INTRAMUSCULAR | Status: DC
  Administered 2013-12-11 – 2013-12-14 (×6): 40 mg via INTRAVENOUS
  Filled 2013-12-11 (×7): qty 4

## 2013-12-11 MED ORDER — FENTANYL CITRATE 0.05 MG/ML IJ SOLN
INTRAMUSCULAR | Status: AC
  Filled 2013-12-11: qty 2

## 2013-12-11 MED ORDER — HEPARIN (PORCINE) IN NACL 2-0.9 UNIT/ML-% IJ SOLN
INTRAMUSCULAR | Status: AC
  Filled 2013-12-11: qty 1000

## 2013-12-11 NOTE — CV Procedure (Signed)
CARDIAC CATHETERIZATION REPORT  Taylor Goodman   [id number] November 21, 1932  Performing Cardiologist: Chrystie Nose Primary Physician: [name], MD Primary Cardiologist:  Dr. Rennis Golden  Procedures Performed:  Left Heart Catheterization via 5 Fr right femoral artery access  Right Heart Catheterization via 7 Fr right femoral vein access  Indication(s): congestive heart failure, prior CABG  Pre-Procedural Diagnosis(es): 1. ICM, EF 15% 2. Acute systolic congestive heart failure 3. CAD s/p CABG x 4  Post-Procedural Diagnosis(es): 1. Occluded SVG to PDA and SVG to OM 2. Patent SVG to Diagonal and LIMA to LAD 3. Severe native 3V CAD with poor targets 4. Elevated right and left heart filling pressures 5. Moderate to severely reduced cardiac output  Pre-Procedural Non-invasive testing: none  History: 79 y.o. male with a history of CAD, status post CABG over 10 years ago x 4 (left internal mammary artery to LAD, saphenous vein graft to first diagonal, saphenous vein graft to OM2, saphenous vein graft to posterior descending). His EF at time of CABG was 30%. He also has PVD, dyslipidemia, hypertension, GERD, and chronic low back pain. He has been seen by Dr. Allyson Sabal in the past, but has not followed up in several years. He initially presented to [facility] with a complaint of worsening SOB and orthopnea. W/u at [facility] revealed CHF. BNP was elevated at 21,216. CXR showed cardiomegaly with diffuse interstitial edema. A 2D echo demonstrated LVEF to be severely depressed at approximately 15 to 20% with diffuse hypokinesis, inferior/posterior akinesis. The cavity size was severely dilated. Wall thickness was increased in a pattern of mild LVH. There was moderate MR. Cardiac enzymes were cycled and were negative x 3. He was given IV Lasix and [facility] and was transferred here to [facility] for further treatment. He was diuresed and presents for a L/RHC to assess coronaries, bypass grafts and filling  pressures.  Consent: The procedure with Risks/Benefits/Alternatives and Indications were reviewed with the patient (and family).  All questions were answered.    Risks / Complications include, but not limited to: Death, MI, CVA/TIA, VF/VT (with defibrillation), Bradycardia (need for temporary pacer placement), contrast induced nephropathy, bleeding / bruising / hematoma / pseudoaneurysm, vascular or coronary injury (with possible emergent CT or Vascular Surgery), adverse medication reactions, infection.    Consent: Risks of procedure as well as the alternatives and risks of each were explained to the (patient/caregiver).  Consent for procedure obtained.  Procedure: The patient was brought to the 2nd Floor [facility] in the fasting state and prepped and draped in the usual sterile fashion for (Right groin) access.   Time Out: Verified patient identification, verified procedure, site/side was marked, verified correct patient position, special equipment/implants available, radiation safety measures in place (including badges and shielding), medications/allergies/relevent history reviewed, required imaging and test results available.  Performed  Procedure: The right femoral head was identified using tactile and fluoroscopic technique.  The right groin was anesthetized with 1% subcutaneous Lidocaine.  The right Common Femoral Artery was accessed using the Modified Seldinger Technique with placement of (5 Fr) sheath using the Seldinger technique.  The sheath was aspirated and flushed. The right Common Femoral Vein was accessed using the Modified Seldinger Technique with placement of (7 Fr) sheath using the Seldinger technique.  The sheath was aspirated and flushed.  A 15F Swan-Ganz catheter was then advanced, with help from an 0.025 Wire into the RA, RV, PA and PCWP positions. Cardiac output and indicies were calculated using the Fick and Thermodilution method.  The Swan-Ganz  catheter was then removed from the body.   A 5 Fr JL4 Catheter was advanced of over a Standard J wire into the ascending Aorta.  The catheter was used to engage the left coronary artery.  Multiple cineangiographic views of the left coronary artery system(s) were performed. A 5 Fr JR4 Catheter was advanced of over a Safety J wire into the ascending Aorta.  The catheter was used to engage the right coronary artery and the 3 saphenous vein grafts.  Multiple cineangiographic views of the right coronary artery system(s) and SVG's were performed. This catheter was then exchanged over the wire for a 45F IM catheter. This was used to engage the LIMA and multiple hand injections were performed.  Finally, the catheter was exchanged over a Standard J wire for an angled Pigtail catheter that was advanced across the Aortic Valve.  LV hemodynamics were measured and the catheter was pulled back across the Aortic Valve for measurement of "pull-back" gradient.  The catheter and the wire was removed completely out of the body. The patient was transferred to the holding area where the sheath was removed with manual pressure held for hemostasis.   Recovery: The patient was transported to the cath lab holding area in stable condition.   The patient  was stable before, during and following the procedure.   Patient did tolerate procedure well. There were not complications.  EBL: Minimal  Medications:  Premedication: none  Sedation:  1 mg IV Versed, 25 mcg IV Fentanyl  Contrast:  75 ml Omnipaque  3 cc 1% lidocaine  Hemodynamics:  Central Aortic Pressure / Mean Aortic Pressure: 109/67  LV Pressure / LV End diastolic Pressure:  22  Right Heart Data:  RA - 13  RV - 53/14  PA - 51/23 (34)   PCWP - 24  TPG - 10  FCO/CI - 2.55 L/min, 1.41 L/min  TDCO/CI - 2.84 L/min, 1.57 L/min  SVR -  27 Wood units ( dynscm?5/80)  PVR - 3.9 Wood units ( dynscm?5/80)  PA Sat% - 40  AO Sat% - 93  Coronary Angiographic  Data:  Left Main:  No significant stenosis.  Left Anterior Descending (LAD):  Branches normally - small caliber and appears occluded in the proximal-mid vessel.  1st diagonal (D1):  Seen filling retrograde - small vessel.  2nd diagonal (D2):  Small vessel  Circumflex (LCx):  Severe proximal stenosis - tapers to small vessel distally.  1st obtuse marginal:  Small vessel, severely diseased         2nd obtuse marginal:  Small vessel Right Coronary Artery: Proximal 80-90% stenosis, with several moderate to severe discrete stenoses distally.  right ventricle branch of right coronary artery: patent, high anterior takeoff  posterior descending artery: Mild to moderate disease  posterior lateral branch:  Small vessel  Grafts  LIMA - LAD: Patent, moderate-sized vessel, with TIMI III flow and good distal runoff.  SVG - DIAG: Patent, small vein graft with TIMI III flow, good runoff to a small diagonal vessel, however.  SVG - OM: Appears flush occluded (100%).  SVG - RCA (RPDA/RPL): Ostially occluded (100%) with a small nub.  Impression: 1.  Occluded SVG to OM and SVG to RCA.  Patent LIMA to LAD and SVG to small Diagonal branch. 2.  Severe native 3V disease without percutaneous revascularization options (discussed with Dr. Herbie Baltimore) 3.  Moderate to severely reduced cardiac output with elevated left and right heart filling pressures. 4.  Elevated SVR and PVR.  Plan: 1.  Medical therapy is recommended as there do not appear to be revascularization options either surgically or percutaneously. 2.  Will increase diuresis as filling pressures remain elevated - restart lasix 40 IV BID. 3.  Plan to increase afterload reduction for elevated SVR as tolerated. 4.  Need to consider LifeVest as a bridge to possible permanent implanted AICD - for EF 15%.  The case and results was discussed with the patient and family if available.  The case and results was not discussed with the patient's  PCP. The case and results was not discussed with the patient's Cardiologist.  Time Spent Directly with the Patient:  75 minutes  Chrystie Nose, MD, [facility] Attending Cardiologist [facility]  HILTY,Kenneth C 12/11/2013, 11:14 AM

## 2013-12-11 NOTE — Progress Notes (Signed)
Patient c/o of increased difficulty breathing. SATS will drop to 86-87, then increasing back to 92-93 when prompting patient to take slow deep breaths. Patient did have breathing treatment, but stated that did not help. Paged K. Schoor Lakeyshia Tuckerman, [place] R

## 2013-12-11 NOTE — Interval H&P Note (Signed)
History and Physical Interval Note:  12/11/2013 8:37 AM  Taylor Goodman  has presented today for surgery, with the diagnosis of cp  The various methods of treatment have been discussed with the patient and family. After consideration of risks, benefits and other options for treatment, the patient has consented to  Procedure(s): LEFT AND RIGHT HEART CATHETERIZATION WITH CORONARY/GRAFT ANGIOGRAM (N/A) as a surgical intervention .  The patient's history has been reviewed, patient examined, no change in status, stable for surgery.  I have reviewed the patient's chart and labs.  Questions were answered to the patient's satisfaction.    Cath Lab Visit (complete for each Cath Lab visit)  Clinical Evaluation Leading to the Procedure:   ACS: no  Non-ACS:    Anginal Classification: No Symptoms  Anti-ischemic medical therapy: Maximal Therapy (2 or more classes of medications)  Non-Invasive Test Results: No non-invasive testing performed  Prior CABG: Previous CABG  Chrystie Nose, MD, [facility] Attending Cardiologist [facility]  HILTY,Kenneth C

## 2013-12-11 NOTE — Progress Notes (Signed)
Patent arrived back to 2 [place] from cath procedure. Report given from cath lab. Patient re-assessed. Vital signs stable, site clean dry and intact. Bedrest per protocol until 1800. Patient stable, no complaints at this time. Frequent vitals set up.  Patient placed back on telemetry. Will continue to monitor closely. Stanton Kidney R

## 2013-12-11 NOTE — Progress Notes (Signed)
DAILY PROGRESS NOTE  Subjective:  Still somewhat short of breath at rest, however lying flat. BNP still >20K. Labs indicate normal serum Cr. EF is newly reduced to 15% by echo - history of CABG x 4 in 2000.  Objective:  Temp:  [97.4 F (36.3 C)-97.9 F (36.6 C)] 97.9 F (36.6 C) (12/29 0501) Pulse Rate:  [87-92] 91 (12/29 0501) Resp:  [24-28] 24 (12/29 0501) BP: (101-131)/(65-77) 101/77 mmHg (12/29 0501) SpO2:  [91 %-98 %] 91 % (12/29 0501) Weight:  [153 lb 8 oz (69.627 kg)] 153 lb 8 oz (69.627 kg) (12/29 0501) Weight change: 1 lb 9.7 oz (0.727 kg)  Intake/Output from previous day: 12/28 0701 - 12/29 0700 In: 600 [P.O.:600] Out: 300 [Urine:300]  Intake/Output from this shift:    Medications: Current Facility-Administered Medications  Medication Dose Route Frequency Provider Last Rate Last Dose  . 0.9 %  sodium chloride infusion  250 mL Intravenous PRN Brittainy Simmons, PA-C      . 0.9 %  sodium chloride infusion   Intravenous Continuous Brittainy Simmons, PA-C 10 mL/hr at 12/11/13 0809 10 mL/hr at 12/11/13 0809  . albuterol (PROVENTIL) (5 MG/ML) 0.5% nebulizer solution 2.5 mg  2.5 mg Nebulization Q2H PRN Leroy Sea, MD      . ALPRAZolam Prudy Feeler) tablet 0.5 mg  0.5 mg Oral TID Leroy Sea, MD   0.5 mg at 12/08/13 1610  . [START ON 12/12/2013] aspirin EC tablet 81 mg  81 mg Oral Daily Runell Gess, MD      . carvedilol (COREG) tablet 3.125 mg  3.125 mg Oral BID WC Leroy Sea, MD   3.125 mg at 12/10/13 1634  . citalopram (CELEXA) tablet 20 mg  20 mg Oral Daily Leroy Sea, MD   20 mg at 12/10/13 1049  . furosemide (LASIX) tablet 40 mg  40 mg Oral Daily Runell Gess, MD   40 mg at 12/10/13 1057  . guaiFENesin-dextromethorphan (ROBITUSSIN DM) 100-10 MG/5ML syrup 5 mL  5 mL Oral Q4H PRN Leroy Sea, MD      . heparin injection 5,000 Units  5,000 Units Subcutaneous Q8H Leroy Sea, MD   5,000 Units at 12/10/13 2110  .  HYDROcodone-acetaminophen (NORCO/VICODIN) 5-325 MG per tablet 1-2 tablet  1-2 tablet Oral Q4H PRN Leroy Sea, MD      . insulin aspart (novoLOG) injection 0-9 Units  0-9 Units Subcutaneous TID WC Leroy Sea, MD   1 Units at 12/10/13 1642  . megestrol (MEGACE) 400 MG/10ML suspension 200 mg  200 mg Oral Daily Leroy Sea, MD   200 mg at 12/08/13 1034  . ondansetron (ZOFRAN) tablet 4 mg  4 mg Oral Q6H PRN Leroy Sea, MD       Or  . ondansetron (ZOFRAN) injection 4 mg  4 mg Intravenous Q6H PRN Leroy Sea, MD      . pantoprazole (PROTONIX) EC tablet 80 mg  80 mg Oral Q1200 Leroy Sea, MD   80 mg at 12/10/13 1345  . sodium chloride 0.9 % injection 3 mL  3 mL Intravenous Q12H Leroy Sea, MD   3 mL at 12/10/13 1051  . sodium chloride 0.9 % injection 3 mL  3 mL Intravenous Q12H Brittainy Simmons, PA-C   3 mL at 12/10/13 2135  . sodium chloride 0.9 % injection 3 mL  3 mL Intravenous PRN Brittainy Simmons, PA-C      . zolpidem (AMBIEN)  tablet 5 mg  5 mg Oral QHS PRN Leroy Sea, MD   5 mg at 12/10/13 2115    Physical Exam: General appearance: alert and no distress Neck: JVD - 2 cm above sternal notch and no carotid bruit Lungs: clear to auscultation bilaterally Heart: regular rate and rhythm, S1, S2 normal, no murmur, click, rub or gallop Abdomen: soft, non-tender; bowel sounds normal; no masses,  no organomegaly Extremities: edema trace bilatereal Pulses: 2+ and symmetric Skin: Skin color, texture, turgor normal. No rashes or lesions Neurologic: Grossly normal Psych: Mood, affect normal  Lab Results: Results for orders placed during the hospital encounter of 12/07/13 (from the past 48 hour(s))  GLUCOSE, CAPILLARY     Status: Abnormal   Collection Time    12/09/13 11:35 AM      Result Value Range   Glucose-Capillary 148 (*) 70 - 99 mg/dL   Comment 1 Notify RN     Comment 2 Documented in Chart    GLUCOSE, CAPILLARY     Status: Abnormal   Collection  Time    12/09/13  4:04 PM      Result Value Range   Glucose-Capillary 124 (*) 70 - 99 mg/dL   Comment 1 Notify RN     Comment 2 Documented in Chart    GLUCOSE, CAPILLARY     Status: Abnormal   Collection Time    12/09/13  9:13 PM      Result Value Range   Glucose-Capillary 148 (*) 70 - 99 mg/dL  BASIC METABOLIC PANEL     Status: Abnormal   Collection Time    12/10/13  5:20 AM      Result Value Range   Sodium 136  135 - 145 mEq/L   Potassium 4.0  3.5 - 5.1 mEq/L   Chloride 101  96 - 112 mEq/L   CO2 23  19 - 32 mEq/L   Glucose, Bld 125 (*) 70 - 99 mg/dL   BUN 39 (*) 6 - 23 mg/dL   Creatinine, Ser 4.13  0.50 - 1.35 mg/dL   Calcium 8.9  8.4 - 24.4 mg/dL   GFR calc non Af Amer 52 (*) >90 mL/min   GFR calc Af Amer 60 (*) >90 mL/min   Comment: (NOTE)     The eGFR has been calculated using the CKD EPI equation.     This calculation has not been validated in all clinical situations.     eGFR's persistently <90 mL/min signify possible Chronic Kidney     Disease.  GLUCOSE, CAPILLARY     Status: Abnormal   Collection Time    12/10/13  6:24 AM      Result Value Range   Glucose-Capillary 123 (*) 70 - 99 mg/dL  PRO B NATRIURETIC PEPTIDE     Status: Abnormal   Collection Time    12/10/13 10:20 AM      Result Value Range   Pro B Natriuretic peptide (BNP) 22359.0 (*) 0 - 450 pg/mL  GLUCOSE, CAPILLARY     Status: Abnormal   Collection Time    12/10/13  1:41 PM      Result Value Range   Glucose-Capillary 174 (*) 70 - 99 mg/dL   Comment 1 Notify RN     Comment 2 Documented in Chart    GLUCOSE, CAPILLARY     Status: Abnormal   Collection Time    12/10/13  4:39 PM      Result Value Range   Glucose-Capillary 121 (*)  70 - 99 mg/dL  GLUCOSE, CAPILLARY     Status: Abnormal   Collection Time    12/10/13  9:32 PM      Result Value Range   Glucose-Capillary 146 (*) 70 - 99 mg/dL  BASIC METABOLIC PANEL     Status: Abnormal   Collection Time    12/11/13  5:32 AM      Result Value Range    Sodium 139  135 - 145 mEq/L   Potassium 4.3  3.5 - 5.1 mEq/L   Chloride 101  96 - 112 mEq/L   CO2 23  19 - 32 mEq/L   Glucose, Bld 148 (*) 70 - 99 mg/dL   BUN 35 (*) 6 - 23 mg/dL   Creatinine, Ser 5.28  0.50 - 1.35 mg/dL   Calcium 9.0  8.4 - 41.3 mg/dL   GFR calc non Af Amer 49 (*) >90 mL/min   GFR calc Af Amer 56 (*) >90 mL/min   Comment: (NOTE)     The eGFR has been calculated using the CKD EPI equation.     This calculation has not been validated in all clinical situations.     eGFR's persistently <90 mL/min signify possible Chronic Kidney     Disease.  PROTIME-INR     Status: None   Collection Time    12/11/13  5:32 AM      Result Value Range   Prothrombin Time 14.4  11.6 - 15.2 seconds   INR 1.14  0.00 - 1.49  CBC     Status: None   Collection Time    12/11/13  5:32 AM      Result Value Range   WBC 8.5  4.0 - 10.5 K/uL   RBC 4.27  4.22 - 5.81 MIL/uL   Hemoglobin 13.1  13.0 - 17.0 g/dL   HCT 24.4  01.0 - 27.2 %   MCV 91.8  78.0 - 100.0 fL   MCH 30.7  26.0 - 34.0 pg   MCHC 33.4  30.0 - 36.0 g/dL   RDW 53.6  64.4 - 03.4 %   Platelets 207  150 - 400 K/uL  GLUCOSE, CAPILLARY     Status: Abnormal   Collection Time    12/11/13  6:19 AM      Result Value Range   Glucose-Capillary 122 (*) 70 - 99 mg/dL    Imaging: No results found.  Assessment:  1. Principal Problem: 2.   Acute on chronic systolic heart failure 3. Active Problems: 4.   GERD (gastroesophageal reflux disease) 5.   High cholesterol 6.   Essential hypertension, benign 7.   Coronary artery disease 8.   PVD (peripheral vascular disease) 9.   SOB (shortness of breath) 10.   Pulmonary edema 11.   CHF (congestive heart failure) 12.   Plan:  1. Suspect he is still volume overloaded. Had been transitioned to po lasix. Plan for [facility] today to assess grafts and filling pressures.   Time Spent Directly with Patient:  15 minutes  Length of Stay:  LOS: 4 days   Chrystie Nose, MD, [facility] Attending  Cardiologist [facility]  Kiel Cockerell C 12/11/2013, 8:32 AM

## 2013-12-12 ENCOUNTER — Inpatient Hospital Stay (HOSPITAL_COMMUNITY): Payer: Medicare Other

## 2013-12-12 DIAGNOSIS — J84112 Idiopathic pulmonary fibrosis: Secondary | ICD-10-CM | POA: Diagnosis present

## 2013-12-12 DIAGNOSIS — R0602 Shortness of breath: Secondary | ICD-10-CM

## 2013-12-12 DIAGNOSIS — J439 Emphysema, unspecified: Secondary | ICD-10-CM

## 2013-12-12 DIAGNOSIS — I509 Heart failure, unspecified: Secondary | ICD-10-CM

## 2013-12-12 DIAGNOSIS — R0902 Hypoxemia: Secondary | ICD-10-CM

## 2013-12-12 DIAGNOSIS — I5023 Acute on chronic systolic (congestive) heart failure: Secondary | ICD-10-CM

## 2013-12-12 HISTORY — DX: Emphysema, unspecified: J43.9

## 2013-12-12 LAB — CBC
HCT: 39.7 % (ref 39.0–52.0)
Hemoglobin: 13.1 g/dL (ref 13.0–17.0)
MCHC: 33 g/dL (ref 30.0–36.0)
MCV: 91.9 fL (ref 78.0–100.0)
Platelets: 195 10*3/uL (ref 150–400)
RBC: 4.32 MIL/uL (ref 4.22–5.81)
WBC: 7.9 10*3/uL (ref 4.0–10.5)

## 2013-12-12 LAB — BASIC METABOLIC PANEL
CO2: 24 mEq/L (ref 19–32)
Calcium: 8.8 mg/dL (ref 8.4–10.5)
Creatinine, Ser: 1.05 mg/dL (ref 0.50–1.35)
Potassium: 4.4 mEq/L (ref 3.7–5.3)
Sodium: 142 mEq/L (ref 137–147)

## 2013-12-12 LAB — GLUCOSE, CAPILLARY
Glucose-Capillary: 114 mg/dL — ABNORMAL HIGH (ref 70–99)
Glucose-Capillary: 196 mg/dL — ABNORMAL HIGH (ref 70–99)

## 2013-12-12 LAB — MAGNESIUM: Magnesium: 2 mg/dL (ref 1.5–2.5)

## 2013-12-12 MED ORDER — ALBUTEROL SULFATE (2.5 MG/3ML) 0.083% IN NEBU
2.5000 mg | INHALATION_SOLUTION | RESPIRATORY_TRACT | Status: DC | PRN
  Administered 2013-12-13: 2.5 mg via RESPIRATORY_TRACT
  Filled 2013-12-12: qty 3

## 2013-12-12 MED ORDER — CARVEDILOL 6.25 MG PO TABS
6.2500 mg | ORAL_TABLET | Freq: Two times a day (BID) | ORAL | Status: DC
  Administered 2013-12-13 – 2013-12-15 (×5): 6.25 mg via ORAL
  Filled 2013-12-12 (×8): qty 1

## 2013-12-12 NOTE — Progress Notes (Signed)
Discussed 10 beat run with Dr Terressa Koyanagi, pt asymptomatic.  Pt SPO2 95-96% 2L, denies complaints.  New orders received, will continue to monitor.

## 2013-12-12 NOTE — Progress Notes (Signed)
Dr Terressa Koyanagi at the bedside, discussed current breathing, lung sounds, VS.  New orders received, will continue to monitor.

## 2013-12-12 NOTE — Progress Notes (Signed)
DAILY PROGRESS NOTE  Subjective:  Events noted overnight, including short run of NSVT.  He also had desaturations.  ?whether he has a component of central sleep apnea. Net negative -570 yesterday.  Plan for additional IV lasix today.  Creatinine has improved overnight.  Objective:  Temp:  [97.4 F (36.3 C)-98.4 F (36.9 C)] 97.4 F (36.3 C) (12/30 0647) Pulse Rate:  [82-117] 117 (12/30 0740) Resp:  [16-24] 17 (12/30 0647) BP: (84-151)/(44-118) 107/74 mmHg (12/30 0740) SpO2:  [77 %-99 %] 93 % (12/30 0647) Weight:  [149 lb 0.5 oz (67.6 kg)] 149 lb 0.5 oz (67.6 kg) (12/30 0647) Weight change: -4 lb 7.5 oz (-2.027 kg)  Intake/Output from previous day: 12/29 0701 - 12/30 0700 In: 1080 [P.O.:1080] Out: 1650 [Urine:1650]  Intake/Output from this shift:    Medications: Current Facility-Administered Medications  Medication Dose Route Frequency Provider Last Rate Last Dose  . 0.9 %  sodium chloride infusion  250 mL Intravenous PRN Chrystie Nose, MD      . acetaminophen (TYLENOL) tablet 650 mg  650 mg Oral Q4H PRN Chrystie Nose, MD      . albuterol (PROVENTIL) (2.5 MG/3ML) 0.083% nebulizer solution 2.5 mg  2.5 mg Nebulization Q2H PRN Runell Gess, MD   2.5 mg at 12/11/13 1654  . ALPRAZolam Prudy Feeler) tablet 0.5 mg  0.5 mg Oral TID Leroy Sea, MD   0.5 mg at 12/08/13 1610  . aspirin EC tablet 81 mg  81 mg Oral Daily Runell Gess, MD      . carvedilol (COREG) tablet 3.125 mg  3.125 mg Oral BID WC Leroy Sea, MD   3.125 mg at 12/12/13 0752  . citalopram (CELEXA) tablet 20 mg  20 mg Oral Daily Leroy Sea, MD   20 mg at 12/10/13 1049  . furosemide (LASIX) injection 40 mg  40 mg Intravenous BID Chrystie Nose, MD   40 mg at 12/12/13 7846  . guaiFENesin-dextromethorphan (ROBITUSSIN DM) 100-10 MG/5ML syrup 5 mL  5 mL Oral Q4H PRN Leroy Sea, MD      . heparin injection 5,000 Units  5,000 Units Subcutaneous Q8H Leroy Sea, MD   5,000 Units at  12/12/13 [phone]  . HYDROcodone-acetaminophen (NORCO/VICODIN) 5-325 MG per tablet 1-2 tablet  1-2 tablet Oral Q4H PRN Leroy Sea, MD      . insulin aspart (novoLOG) injection 0-9 Units  0-9 Units Subcutaneous TID WC Leroy Sea, MD   1 Units at 12/11/13 1717  . megestrol (MEGACE) 400 MG/10ML suspension 200 mg  200 mg Oral Daily Leroy Sea, MD   200 mg at 12/08/13 1034  . ondansetron (ZOFRAN) tablet 4 mg  4 mg Oral Q6H PRN Leroy Sea, MD       Or  . ondansetron (ZOFRAN) injection 4 mg  4 mg Intravenous Q6H PRN Leroy Sea, MD      . ondansetron (ZOFRAN) injection 4 mg  4 mg Intravenous Q6H PRN Chrystie Nose, MD      . pantoprazole (PROTONIX) EC tablet 80 mg  80 mg Oral Q1200 Leroy Sea, MD   80 mg at 12/10/13 1345  . sodium chloride 0.9 % injection 3 mL  3 mL Intravenous Q12H Leroy Sea, MD   3 mL at 12/11/13 2117  . sodium chloride 0.9 % injection 3 mL  3 mL Intravenous Q12H Chrystie Nose, MD      . sodium chloride  0.9 % injection 3 mL  3 mL Intravenous PRN Chrystie Nose, MD      . zolpidem (AMBIEN) tablet 5 mg  5 mg Oral QHS PRN Leroy Sea, MD   5 mg at 12/11/13 2309    Physical Exam: General appearance: alert, no distress and lying supine on oxygen Neck: JVD - 3 cm above sternal notch and no carotid bruit Lungs: diminished breath sounds apex - right and bilaterally Heart: regular rate and rhythm and occasional ectopy Abdomen: soft, non-tender; bowel sounds normal; no masses,  no organomegaly Extremities: extremities normal, atraumatic, no cyanosis or edema Pulses: 2+ and symmetric the right groin cath site is without eccyhmosis, bruit or hematoma Skin: Skin color, texture, turgor normal. No rashes or lesions Neurologic: Mental status: Alert, oriented, thought content appropriate Psych: Mood, affect normal  Lab Results: Results for orders placed during the hospital encounter of 12/07/13 (from the past 48 hour(s))  PRO B NATRIURETIC  PEPTIDE     Status: Abnormal   Collection Time    12/10/13 10:20 AM      Result Value Range   Pro B Natriuretic peptide (BNP) 22359.0 (*) 0 - 450 pg/mL  GLUCOSE, CAPILLARY     Status: Abnormal   Collection Time    12/10/13  1:41 PM      Result Value Range   Glucose-Capillary 174 (*) 70 - 99 mg/dL   Comment 1 Notify RN     Comment 2 Documented in Chart    GLUCOSE, CAPILLARY     Status: Abnormal   Collection Time    12/10/13  4:39 PM      Result Value Range   Glucose-Capillary 121 (*) 70 - 99 mg/dL  GLUCOSE, CAPILLARY     Status: Abnormal   Collection Time    12/10/13  9:32 PM      Result Value Range   Glucose-Capillary 146 (*) 70 - 99 mg/dL  BASIC METABOLIC PANEL     Status: Abnormal   Collection Time    12/11/13  5:32 AM      Result Value Range   Sodium 139  135 - 145 mEq/L   Potassium 4.3  3.5 - 5.1 mEq/L   Chloride 101  96 - 112 mEq/L   CO2 23  19 - 32 mEq/L   Glucose, Bld 148 (*) 70 - 99 mg/dL   BUN 35 (*) 6 - 23 mg/dL   Creatinine, Ser 1.61  0.50 - 1.35 mg/dL   Calcium 9.0  8.4 - 09.6 mg/dL   GFR calc non Af Amer 49 (*) >90 mL/min   GFR calc Af Amer 56 (*) >90 mL/min   Comment: (NOTE)     The eGFR has been calculated using the CKD EPI equation.     This calculation has not been validated in all clinical situations.     eGFR's persistently <90 mL/min signify possible Chronic Kidney     Disease.  PROTIME-INR     Status: None   Collection Time    12/11/13  5:32 AM      Result Value Range   Prothrombin Time 14.4  11.6 - 15.2 seconds   INR 1.14  0.00 - 1.49  CBC     Status: None   Collection Time    12/11/13  5:32 AM      Result Value Range   WBC 8.5  4.0 - 10.5 K/uL   RBC 4.27  4.22 - 5.81 MIL/uL   Hemoglobin 13.1  13.0 -  17.0 g/dL   HCT 96.0  45.4 - 09.8 %   MCV 91.8  78.0 - 100.0 fL   MCH 30.7  26.0 - 34.0 pg   MCHC 33.4  30.0 - 36.0 g/dL   RDW 11.9  14.7 - 82.9 %   Platelets 207  150 - 400 K/uL  GLUCOSE, CAPILLARY     Status: Abnormal   Collection Time      12/11/13  6:19 AM      Result Value Range   Glucose-Capillary 122 (*) 70 - 99 mg/dL  POCT ACTIVATED CLOTTING TIME     Status: None   Collection Time    12/11/13  9:53 AM      Result Value Range   Activated Clotting Time 132    POCT I-STAT 3, BLOOD GAS (G3P V)     Status: Abnormal   Collection Time    12/11/13  9:53 AM      Result Value Range   pH, Ven 7.376 (*) 7.250 - 7.300   pCO2, Ven 42.2 (*) 45.0 - 50.0 mmHg   pO2, Ven 24.0 (*) 30.0 - 45.0 mmHg   Bicarbonate 24.8 (*) 20.0 - 24.0 mEq/L   TCO2 26  0 - 100 mmol/L   O2 Saturation 40.0     Acid-base deficit 1.0  0.0 - 2.0 mmol/L   Sample type VENOUS     Comment NOTIFIED PHYSICIAN    POCT I-STAT 3, BLOOD GAS (G3+)     Status: Abnormal   Collection Time    12/11/13 10:04 AM      Result Value Range   pH, Arterial 7.432  7.350 - 7.450   pCO2 arterial 31.4 (*) 35.0 - 45.0 mmHg   pO2, Arterial 63.0 (*) 80.0 - 100.0 mmHg   Bicarbonate 21.0  20.0 - 24.0 mEq/L   TCO2 22  0 - 100 mmol/L   O2 Saturation 93.0     Acid-base deficit 3.0 (*) 0.0 - 2.0 mmol/L   Sample type ARTERIAL    GLUCOSE, CAPILLARY     Status: Abnormal   Collection Time    12/11/13 10:56 AM      Result Value Range   Glucose-Capillary 102 (*) 70 - 99 mg/dL  GLUCOSE, CAPILLARY     Status: Abnormal   Collection Time    12/11/13  4:45 PM      Result Value Range   Glucose-Capillary 147 (*) 70 - 99 mg/dL   Comment 1 Notify RN    GLUCOSE, CAPILLARY     Status: Abnormal   Collection Time    12/11/13  8:41 PM      Result Value Range   Glucose-Capillary 123 (*) 70 - 99 mg/dL  BASIC METABOLIC PANEL     Status: Abnormal   Collection Time    12/12/13  6:25 AM      Result Value Range   Sodium 142  137 - 147 mEq/L   Comment: Please note change in reference range.   Potassium 4.4  3.7 - 5.3 mEq/L   Comment: Please note change in reference range.   Chloride 101  96 - 112 mEq/L   CO2 24  19 - 32 mEq/L   Glucose, Bld 115 (*) 70 - 99 mg/dL   BUN 27 (*) 6 - 23 mg/dL    Creatinine, Ser 5.62  0.50 - 1.35 mg/dL   Calcium 8.8  8.4 - 13.0 mg/dL   GFR calc non Af Amer 65 (*) >90 mL/min   GFR  calc Af Amer 76 (*) >90 mL/min   Comment: (NOTE)     The eGFR has been calculated using the CKD EPI equation.     This calculation has not been validated in all clinical situations.     eGFR's persistently <90 mL/min signify possible Chronic Kidney     Disease.  CBC     Status: None   Collection Time    12/12/13  6:25 AM      Result Value Range   WBC 7.9  4.0 - 10.5 K/uL   RBC 4.32  4.22 - 5.81 MIL/uL   Hemoglobin 13.1  13.0 - 17.0 g/dL   HCT 16.1  09.6 - 04.5 %   MCV 91.9  78.0 - 100.0 fL   MCH 30.3  26.0 - 34.0 pg   MCHC 33.0  30.0 - 36.0 g/dL   RDW 40.9  81.1 - 91.4 %   Platelets 195  150 - 400 K/uL  MAGNESIUM     Status: None   Collection Time    12/12/13  6:25 AM      Result Value Range   Magnesium 2.0  1.5 - 2.5 mg/dL    Imaging: No results found.  Assessment:  Principal Problem:   Acute on chronic systolic heart failure Active Problems:   GERD (gastroesophageal reflux disease)   High cholesterol   Essential hypertension, benign   Coronary artery disease   PVD (peripheral vascular disease)   SOB (shortness of breath)   Pulmonary edema   CHF (congestive heart failure)   Bullous emphysema 1.     Plan:  1. Hypoxia and dyspnea noted overnight. May be due to post-procedural hydration for renal prophylaxis. Additional IV lasix to be given today.  His CT angiogram from 12/25 demonstrates severe bullous emphysema, with fibrotic changes.  ?if there has been development of small pneumothorax.  Will check 2 view CXR today. He is not on any inhalers and has never seen a pulmonologist. Will ask pulmonary to consult today for recommendations. Also, I question if he may have a component of central sleep apnea - will check overnight oximetry. Estimate 1-2 days more for diuresis and pulmonary optimization. Increase coreg to 6.25 mg BID for NSVT, CHF and  tachycardia.  Time Spent Directly with Patient:  15 minutes  Length of Stay:  LOS: 5 days   Chrystie Nose, MD, [facility] Attending Cardiologist [facility]  Rui Wordell C 12/12/2013, 8:09 AM

## 2013-12-12 NOTE — Progress Notes (Signed)
SPO2 down to 88%, O2 back to 3L.  Denies complaints, will continue to monitor.

## 2013-12-12 NOTE — Progress Notes (Signed)
Pt states DOE, denies difficulty breathing, SPO2 94% 3L [place].  No tachypnea, no abdominal breathing.  Will continue to monitor.

## 2013-12-12 NOTE — Progress Notes (Signed)
SPO2 82%, pt wasn't wearing O2 properly. NAD in current state.  After placing prongs in his nose, SPO2 came up to 86%.  O2 turned up to 5L [place].  Will place on continuous SPO2 and continue to monitor.

## 2013-12-12 NOTE — Consult Note (Signed)
Name: Taylor Goodman MRN: [id number] DOB: May 04, 1932    ADMISSION DATE:  12/07/2013 CONSULTATION DATE:  12/12/2013  REFERRING MD :  Rennis Golden PRIMARY SERVICE:  Cards  CHIEF COMPLAINT:  Sob, hypoxia   HISTORY OF PRESENT ILLNESS:  79 y.o remote smoker presented to [facility] with a complaint of worsening SOB and orthopnea. W/u at [facility] revealed CHF. BNP was elevated at 21,216. CXR showed cardiomegaly with diffuse interstitial edema. A 2D echo demonstrated LVEF to be severely depressed at 15 % with diffuse hypokinesis, inferior/posterior akinesis, mod MR.(was prior 30%) Cardiac ath did not show revasc options, AICD planned. He had short run of NSVT overnight. &  Desaturations CXr 12/30 showed diffuse  thickening of interstitial markings slightly less prominent when  compared to previous study.  CT angio showed severe emphysematous change in both lungs. Basilar fibrotic changes were present bilaterally. In the upper lung zones subpleural fibrotic changes with numerous bullous lesions . 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 Hypertension   . Hypercholesteremia   . Coronary artery disease   . Myocardial infarction   . Chronic back pain   . Peripheral vascular disease   . GERD (gastroesophageal reflux disease)    Past Surgical History  Procedure Laterality Date  . Cholecystectomy    . Hernia repair      X 3  . Back surgery    . Colonoscopy  03/31/2012    Procedure: COLONOSCOPY;  Surgeon: Malissa Hippo, MD;  Location: [facility];   Service: Endoscopy;  Laterality: N/A;  1030  . Esophagogastroduodenoscopy N/A 07/05/2013    Procedure: ESOPHAGOGASTRODUODENOSCOPY (EGD);  Surgeon: Malissa Hippo, MD;  Location: [facility];  Service: Endoscopy;  Laterality: N/A;  200-moved to 12:00pm Ann to notify pt  . Coronary artery bypass graft     Prior to Admission medications   Medication Sig Start Date End Date Taking? Authorizing Provider  amLODipine (NORVASC) 5 MG tablet Take 5 mg by mouth daily. 11/21/13  Yes Historical Provider, MD  aspirin EC 81 MG tablet Take 81 mg by mouth daily.   Yes Historical Provider, MD  azithromycin (ZITHROMAX) 250 MG tablet Take 250 mg by mouth daily. 12/04/13  Yes Historical Provider, MD  omeprazole (PRILOSEC OTC) 20 MG tablet Take 20 mg by mouth daily.   Yes Historical Provider, MD  simvastatin (ZOCOR) 40 MG tablet Take 40 mg by mouth daily.   Yes Historical Provider, MD  citalopram (CELEXA) 20 MG tablet Take 1 tablet (20 mg total) by mouth daily. 08/07/13   Malissa Hippo, MD  HYDROcodone-acetaminophen (LORCET) 10-650 MG per tablet Take 0.05-1 tablets by mouth 2 (two) times daily as needed. Pain    Historical Provider, MD  zolpidem (AMBIEN) 10 MG tablet Take 1 tablet by mouth at bedtime as needed for sleep.  04/04/13   Historical Provider, MD   No Known Allergies  FAMILY HISTORY:  Family History  Problem Relation Age of Onset  . Colon cancer Sister   .  Pancreatic cancer Paternal Uncle    SOCIAL HISTORY:  reports that he has quit smoking. His smoking use included Cigarettes. He has a 15 pack-year smoking history. He has never used smokeless tobacco. He reports that he does not drink alcohol or use illicit drugs.  REVIEW OF SYSTEMS:   Constitutional: Negative for fever, chills, weight loss, malaise/fatigue and diaphoresis.  HENT: Negative for hearing loss, ear pain, nosebleeds, congestion, sore throat, neck pain, tinnitus and ear discharge.   Eyes: Negative for blurred vision, double vision,  photophobia, pain, discharge and redness.  Respiratory: Negative for cough, hemoptysis, sputum production, wheezing and stridor.   Cardiovascular: Negative for chest pain, palpitations, orthopnea, claudication, leg swelling and PND.  Gastrointestinal: Negative for heartburn, nausea, vomiting, abdominal pain, diarrhea, constipation, blood in stool and melena.  Genitourinary: Negative for dysuria, urgency, frequency, hematuria and flank pain.  Musculoskeletal: Negative for myalgias, back pain, joint pain and falls.  Skin: Negative for itching and rash.  Neurological: Negative for dizziness, tingling, tremors, sensory change, speech change, focal weakness, seizures, loss of consciousness, weakness and headaches.  Endo/Heme/Allergies: Negative for environmental allergies and polydipsia. Does not bruise/bleed easily.  SUBJECTIVE:   VITAL SIGNS: Temp:  [97.4 F (36.3 C)-98.4 F (36.9 C)] 97.4 F (36.3 C) (12/30 0647) Pulse Rate:  [86-117] 117 (12/30 0740) Resp:  [16-22] 17 (12/30 0647) BP: (84-132)/(44-106) 107/74 mmHg (12/30 0740) SpO2:  [77 %-99 %] 93 % (12/30 0647) Weight:  [67.6 kg (149 lb 0.5 oz)] 67.6 kg (149 lb 0.5 oz) (12/30 0647)  PHYSICAL EXAMINATION: Gen. Pleasant, well-nourished, in no distress, normal affect ENT - no lesions, no post nasal drip Neck: No JVD, no thyromegaly, no carotid bruits Lungs: no use of accessory muscles, no dullness to percussion, bibasal 1/3 rales, no rhonchi  Cardiovascular: Rhythm regular, heart sounds  normal, no murmurs, no peripheral edema Abdomen: soft and non-tender, no hepatosplenomegaly, BS normal. Musculoskeletal: No deformities, no cyanosis or clubbing Neuro:  alert, non focal Skin:  Warm, no lesions/ rash    Recent Labs Lab 12/10/13 0520 12/11/13 0532 12/12/13 0625  NA 136 139 142  K 4.0 4.3 4.4  CL 101 101 101  CO2 23 23 24   BUN 39* 35* 27*  CREATININE 1.28 1.34 1.05  GLUCOSE 125* 148* 115*    Recent Labs Lab  12/08/13 0321 12/11/13 0532 12/12/13 0625  HGB 13.2 13.1 13.1  HCT 40.2 39.2 39.7  WBC 7.5 8.5 7.9  PLT 166 207 195   Dg Chest 2 View  12/12/2013   CLINICAL DATA:  Emphysema, CHF, desaturation overnight  EXAM: CHEST  2 VIEW  COMPARISON:  12/07/2013  FINDINGS: Low lung volumes. Cardiac silhouette is enlarged. There is diffuse thickening of interstitial markings slightly less prominent when compared to previous study. Areas peribronchial cuffing are appreciated small is indistinctness of the pulmonary vasculature. No focal regions of consolidation or focal infiltrates. Patient is status post median sternotomy coronary artery bypass grafting. The osseous structures are unremarkable.  IMPRESSION: Shallow inspiration.Improved interstitial infiltrate consistent with improved pulmonary edema. Underlying component of pulmonary fibrosis is also a diagnostic consideration. Atherosclerotic calcifications are appreciated.   Electronically Signed   By: Salome Holmes M.D.   On: 12/12/2013 10:14    ASSESSMENT / PLAN:  Interstitial lung disease - favor IPF given chronicity on old imaging as above ? COPD - quit smoking 1987 Hypoxia  Recommend - will likely need O2 on discharge PFTs as outpt -then decide on bronchiodilators Will obtain basic serology -ANA, ESR, Ra  factor & CCP, no evidence of collagen vascular disease Sleep study as out pt to r/o central sleep apnea with systolic heart failure - I have made appt  Oretha Milch   Pulmonary and Critical Care Medicine [facility] Pager: [phone]  12/12/2013, 1:03 PM

## 2013-12-13 LAB — GLUCOSE, CAPILLARY
Glucose-Capillary: 123 mg/dL — ABNORMAL HIGH (ref 70–99)
Glucose-Capillary: 116 mg/dL — ABNORMAL HIGH (ref 70–99)
Glucose-Capillary: 104 mg/dL — ABNORMAL HIGH (ref 70–99)

## 2013-12-13 LAB — BASIC METABOLIC PANEL
BUN: 27 mg/dL — ABNORMAL HIGH (ref 6–23)
CO2: 29 mEq/L (ref 19–32)
GFR calc non Af Amer: 63 mL/min — ABNORMAL LOW (ref >90)
Glucose, Bld: 128 mg/dL — ABNORMAL HIGH (ref 70–99)
Potassium: 3.9 mEq/L (ref 3.7–5.3)
Sodium: 144 mEq/L (ref 137–147)

## 2013-12-13 LAB — RHEUMATOID FACTOR: Rhuematoid fact SerPl-aCnc: 34 IU/mL — ABNORMAL HIGH (ref <=14)

## 2013-12-13 MED ORDER — DIGOXIN 0.0625 MG HALF TABLET
0.0625 mg | ORAL_TABLET | Freq: Every day | ORAL | Status: DC
  Administered 2013-12-13 – 2013-12-15 (×3): 0.0625 mg via ORAL
  Filled 2013-12-13 (×4): qty 1

## 2013-12-13 MED ORDER — LISINOPRIL 2.5 MG PO TABS
2.5000 mg | ORAL_TABLET | Freq: Every day | ORAL | Status: DC
  Administered 2013-12-13 – 2013-12-15 (×3): 2.5 mg via ORAL
  Filled 2013-12-13 (×3): qty 1

## 2013-12-13 NOTE — Progress Notes (Signed)
Patient's breathing remains less labored this evening. O2 sat remains 92-94% on 4L nasal cannula. Assisted patient out of bed to chair. O2 sat remains 94% in chair. Patient weak from hospitalization; could benefit from PT. Call bell near. Patient instructed to notify RN of need to get OOB; verbalized understanding. Call bell near.Taylor Goodman

## 2013-12-13 NOTE — Progress Notes (Signed)
Nursing Note: Oxygen levels drop w/ the least bit of activity.wbb

## 2013-12-13 NOTE — Progress Notes (Signed)
Nursing Note: Paged on-call earlier at 0145 and call returned.Made Dr.Balfour aware that pt had pulse ox alarm to sound for low sat 88% and rn went and noted low sat of 87  And  heart rate of 46.Heart  rate jumped back to 82 within 5 seconds and sats to 100% at 0125.No new orders obtained but to continue to monitor pt.No distress noted and no complaints but pt not sleeping.T-98.4 p-46 then quickly back up to 82  r-22 bp 100/60 .Made aware of results form cath on 12/29 and of bp earlier of 95/65 this pm and that coreg dose was held earlier before shift change when I arrived.Also made aware that pt has had some pvcs and that chart shows twice where pt had unifocal pvcs.Will continue to monitor.wbb

## 2013-12-13 NOTE — Progress Notes (Signed)
[employer] order faxed. [employer] representative has been notified.  Hollyn Stucky, PA-C

## 2013-12-13 NOTE — Progress Notes (Signed)
O2 sat drops with the least exertion from patient Resp 22 and labored at times. Patient states this is better than when first admitted. O2 sat dropped to 77% but noted that nasal cannula was not in nares. Replaced nasal cannula on 3.5L and encouraged deep breathing. O2 sat retured to 91%. Will continue to monitor.Taylor Goodman

## 2013-12-13 NOTE — Progress Notes (Signed)
Subjective: Pt reports that breathing is slightly better, although his breathing appears slightly labored (on 4L [place] and breathing through mouth). He denies CP.   Objective: Vital signs in last 24 hours: Temp:  [97.7 F (36.5 C)-98.4 F (36.9 C)] 97.8 F (36.6 C) (12/31 0721) Pulse Rate:  [46-116] 105 (12/31 0721) Resp:  [18-24] 24 (12/31 0721) BP: (95-120)/(50-73) 120/50 mmHg (12/31 0721) SpO2:  [88 %-97 %] 92 % (12/31 0721) Last BM Date: 12/12/13  Intake/Output from previous day: 12/30 0701 - 12/31 0700 In: 840 [P.O.:840] Out: 1150 [Urine:1150] Intake/Output this shift:    Medications Current Facility-Administered Medications  Medication Dose Route Frequency Provider Last Rate Last Dose  . 0.9 %  sodium chloride infusion  250 mL Intravenous PRN Chrystie Nose, MD      . acetaminophen (TYLENOL) tablet 650 mg  650 mg Oral Q4H PRN Chrystie Nose, MD      . albuterol (PROVENTIL) (2.5 MG/3ML) 0.083% nebulizer solution 2.5 mg  2.5 mg Nebulization Q2H PRN Runell Gess, MD      . ALPRAZolam Prudy Feeler) tablet 0.5 mg  0.5 mg Oral TID Leroy Sea, MD   0.5 mg at 12/08/13 1610  . aspirin EC tablet 81 mg  81 mg Oral Daily Runell Gess, MD   81 mg at 12/12/13 1052  . carvedilol (COREG) tablet 6.25 mg  6.25 mg Oral BID WC Chrystie Nose, MD   6.25 mg at 12/13/13 0802  . citalopram (CELEXA) tablet 20 mg  20 mg Oral Daily Leroy Sea, MD   20 mg at 12/12/13 1052  . furosemide (LASIX) injection 40 mg  40 mg Intravenous BID Chrystie Nose, MD   40 mg at 12/13/13 1610  . guaiFENesin-dextromethorphan (ROBITUSSIN DM) 100-10 MG/5ML syrup 5 mL  5 mL Oral Q4H PRN Leroy Sea, MD      . heparin injection 5,000 Units  5,000 Units Subcutaneous Q8H Leroy Sea, MD   5,000 Units at 12/13/13 [phone]  . HYDROcodone-acetaminophen (NORCO/VICODIN) 5-325 MG per tablet 1-2 tablet  1-2 tablet Oral Q4H PRN Leroy Sea, MD      . insulin aspart (novoLOG) injection 0-9 Units  0-9  Units Subcutaneous TID WC Leroy Sea, MD   1 Units at 12/13/13 0803  . megestrol (MEGACE) 400 MG/10ML suspension 200 mg  200 mg Oral Daily Leroy Sea, MD   200 mg at 12/12/13 1053  . ondansetron (ZOFRAN) tablet 4 mg  4 mg Oral Q6H PRN Leroy Sea, MD       Or  . ondansetron (ZOFRAN) injection 4 mg  4 mg Intravenous Q6H PRN Leroy Sea, MD      . ondansetron (ZOFRAN) injection 4 mg  4 mg Intravenous Q6H PRN Chrystie Nose, MD      . pantoprazole (PROTONIX) EC tablet 80 mg  80 mg Oral Q1200 Leroy Sea, MD   80 mg at 12/10/13 1345  . sodium chloride 0.9 % injection 3 mL  3 mL Intravenous Q12H Leroy Sea, MD   3 mL at 12/11/13 2117  . sodium chloride 0.9 % injection 3 mL  3 mL Intravenous Q12H Chrystie Nose, MD   3 mL at 12/12/13 2207  . sodium chloride 0.9 % injection 3 mL  3 mL Intravenous PRN Chrystie Nose, MD      . zolpidem (AMBIEN) tablet 5 mg  5 mg Oral QHS PRN Leroy Sea, MD  5 mg at 12/12/13 2215    PE: General appearance: alert, cooperative and no distress Lungs: diffuse bilateral crackles Heart: regular rate and rhythm Extremities: no LEE Pulses: 2+ and symmetric Skin: warm and dry Neurologic: Grossly normal  Lab Results:   Recent Labs  12/11/13 0532 12/12/13 0625  WBC 8.5 7.9  HGB 13.1 13.1  HCT 39.2 39.7  PLT 207 195   BMET  Recent Labs  12/11/13 0532 12/12/13 0625 12/13/13 0532  NA 139 142 144  K 4.3 4.4 3.9  CL 101 101 101  CO2 23 24 29   GLUCOSE 148* 115* 128*  BUN 35* 27* 27*  CREATININE 1.34 1.05 1.09  CALCIUM 9.0 8.8 8.7   PT/INR  Recent Labs  12/11/13 0532  LABPROT 14.4  INR 1.14     Assessment/Plan    Principal Problem:   Acute on chronic systolic heart failure Active Problems:   GERD (gastroesophageal reflux disease)   High cholesterol   Essential hypertension, benign   Coronary artery disease   PVD (peripheral vascular disease)   SOB (shortness of breath)   Pulmonary edema   CHF  (congestive heart failure)   Bullous emphysema   IPF (idiopathic pulmonary fibrosis)  Plan: s/p diagnostic LHC revealing severe 3 vessel disease w/o percutaneous revascularization options. Ef of 15%. Continue medical therapy. On BB. Will add low dose ACE-I. Renal function is normal. He continues to have NSVT on telemetry. HR sinus tach currently with rate in the low 100s. May consider adding low dose digoxin.  He will need an ICD. If not done this hospitalization, then he will need bridge w/ LifeVest. He continues to have labored breathing on 4L [place] and continues to desat into the low 80s/upper 70s. He just received a dose of 40 mg IV Lasix at 0800. I/Os net negative. ? Moving to higher level of care, given respiratory status.  Seen by Pulmonology yesterday. Recs are as follows: 1) will likely need O2 on discharge  2) PFTs as outpt -then decide on bronchiodilators  3) Will obtain basic serology -ANA, ESR, Ra factor & CCP, no evidence of collagen vascular disease 4) Sleep study as out pt to r/o central sleep apnea with systolic heart failure (already scheduled).    LOS: 6 days    Brittainy M. Delmer Islam 12/13/2013 8:25 AM   Patient seen and examined. Agree with assessment and plan. Ischemic cardiomyopathy with 2 occluded and 3 v CAD. Renal function adequate.  Will initiate low dose lisinopril at 2.5 mg today and titrate as BP  And renal fxn allow. Will also add lanoxin at 0.0625 mg. ? If desaturation at night related to complex sleep apnea or possible cheyne-stokes respiration with EF 15%. Re-check renal profile in am.   Lennette Bihari, MD, [facility] 12/13/2013 8:58 AM

## 2013-12-13 NOTE — Progress Notes (Addendum)
   Name: [place] Goodman MRN: [id number] DOB: [birth year]    ADMISSION DATE:  12/07/2013 CONSULTATION DATE:  12/13/2013  REFERRING MD :  Rennis Golden PRIMARY SERVICE:  Cards  CHIEF COMPLAINT:  Sob, hypoxia   Brief Summary:  79 y/o M admitted 12/25 with worsening SOB & orthopnea in setting of CHF.  PCCM consulted for dyspnea / hypoxemia   SUBJECTIVE: "I feel a little better".  Noted documentation overnight of desaturations with O2 off.   VITAL SIGNS: Temp:  [97.7 F (36.5 C)-98.4 F (36.9 C)] 97.8 F (36.6 C) (12/31 0721) Pulse Rate:  [46-116] 105 (12/31 0721) Resp:  [18-24] 24 (12/31 0721) BP: (95-120)/(50-73) 120/50 mmHg (12/31 0721) SpO2:  [88 %-97 %] 92 % (12/31 0721)  PHYSICAL EXAMINATION: Gen. Pleasant, well-nourished, in no distress, normal affect ENT - no lesions, no post nasal drip Neck: No JVD Lungs: no use of accessory muscles, bibasalar 1/3 rales, no rhonchi  Cardiovascular: Rhythm regular, heart sounds  normal, no murmurs, no peripheral edema Abdomen: soft and non-tender, BS normal. Musculoskeletal: No deformities, no cyanosis or clubbing Neuro:  alert, non focal Skin:  Warm, no lesions/ rash    Recent Labs Lab 12/11/13 0532 12/12/13 0625 12/13/13 0532  NA 139 142 144  K 4.3 4.4 3.9  CL 101 101 101  CO2 23 24 29   BUN 35* 27* 27*  CREATININE 1.34 1.05 1.09  GLUCOSE 148* 115* 128*    Recent Labs Lab 12/08/13 0321 12/11/13 0532 12/12/13 0625  HGB 13.2 13.1 13.1  HCT 40.2 39.2 39.7  WBC 7.5 8.5 7.9  PLT 166 207 195   Dg Chest 2 View  12/12/2013   CLINICAL DATA:  Emphysema, CHF, desaturation overnight  EXAM: CHEST  2 VIEW  COMPARISON:  12/07/2013  FINDINGS: Low lung volumes. Cardiac silhouette is enlarged. There is diffuse thickening of interstitial markings slightly less prominent when compared to previous study. Areas peribronchial cuffing are appreciated small is indistinctness of the pulmonary vasculature. No focal regions of consolidation or  focal infiltrates. Patient is status post median sternotomy coronary artery bypass grafting. The osseous structures are unremarkable.  IMPRESSION: Shallow inspiration.Improved interstitial infiltrate consistent with improved pulmonary edema. Underlying component of pulmonary fibrosis is also a diagnostic consideration. Atherosclerotic calcifications are appreciated.   Electronically Signed   By: Salome Holmes M.D.   On: 12/12/2013 10:14    ASSESSMENT / PLAN:  Interstitial lung disease - favor IPF given chronicity on old imaging as above ? COPD - quit smoking 1987, 1ppd x ~20 years Hypoxia  Plan: -assess for O2 needs prior to d/c, anticipate he will need for discharge -PFTs as outpt -then decide on bronchiodilators -obtain basic serology -ANA, ESR, RA factor & CCP, no evidence of collagen vascular disease -Sleep study as out pt to r/o central sleep apnea with systolic heart failure -appt arranged -mobilize as able -diuresis per Cardiology   Canary Brim, NP-C [facility] Pgr: [phone] or [phone]  Independently examined pt, evaluated data & formulated above care plan with NP who scribed this note & edited by me.  PCCM available as needed [place] Fu with dr Marchelle Gearing on discharge  [facility]  12/13/2013, 9:47 AM

## 2013-12-14 LAB — BASIC METABOLIC PANEL
BUN: 32 mg/dL — ABNORMAL HIGH (ref 6–23)
CO2: 27 mEq/L (ref 19–32)
Calcium: 9 mg/dL (ref 8.4–10.5)
Chloride: 99 mEq/L (ref 96–112)
Creatinine, Ser: 1.11 mg/dL (ref 0.50–1.35)
GFR calc Af Amer: 71 mL/min — ABNORMAL LOW (ref >90)
GFR calc non Af Amer: 61 mL/min — ABNORMAL LOW (ref >90)
Glucose, Bld: 117 mg/dL — ABNORMAL HIGH (ref 70–99)
Potassium: 3.7 mEq/L (ref 3.7–5.3)
Sodium: 141 mEq/L (ref 137–147)

## 2013-12-14 LAB — GLUCOSE, CAPILLARY
GLUCOSE-CAPILLARY: 125 mg/dL — AB (ref 70–99)
GLUCOSE-CAPILLARY: 129 mg/dL — AB (ref 70–99)
Glucose-Capillary: 114 mg/dL — ABNORMAL HIGH (ref 70–99)
Glucose-Capillary: 114 mg/dL — ABNORMAL HIGH (ref 70–99)

## 2013-12-14 MED ORDER — FUROSEMIDE 40 MG PO TABS
40.0000 mg | ORAL_TABLET | Freq: Two times a day (BID) | ORAL | Status: DC
  Administered 2013-12-14 – 2013-12-15 (×2): 40 mg via ORAL
  Filled 2013-12-14 (×4): qty 1

## 2013-12-14 MED ORDER — FUROSEMIDE 40 MG PO TABS: 40.0000 mg | ORAL_TABLET | Freq: Two times a day (BID) | ORAL | Status: AC

## 2013-12-14 MED ORDER — CARVEDILOL 6.25 MG PO TABS: 6.2500 mg | ORAL_TABLET | Freq: Two times a day (BID) | ORAL | Status: AC

## 2013-12-14 MED ORDER — MEGESTROL ACETATE 40 MG/ML PO SUSP: 200.0000 mg | Freq: Every day | ORAL | Status: AC

## 2013-12-14 MED ORDER — ALPRAZOLAM 0.5 MG PO TABS: 0.5000 mg | ORAL_TABLET | Freq: Three times a day (TID) | ORAL | Status: AC | PRN

## 2013-12-14 MED ORDER — GUAIFENESIN-DM 100-10 MG/5ML PO SYRP: 5.0000 mL | ORAL_SOLUTION | ORAL | Status: AC | PRN

## 2013-12-14 MED ORDER — LISINOPRIL 2.5 MG PO TABS
2.5000 mg | ORAL_TABLET | Freq: Every day | ORAL | Status: DC
Start: 2013-12-14 — End: 2013-12-15

## 2013-12-14 MED ORDER — DIGOXIN 62.5 MCG PO TABS: 0.0625 mg | ORAL_TABLET | Freq: Every day | ORAL | Status: AC

## 2013-12-14 NOTE — Progress Notes (Signed)
SATURATION QUALIFICATIONS: (This note is used to comply with regulatory documentation for home oxygen)  Patient Saturations on Room Air at Rest = 89%  Patient Saturations on Room Air while Ambulating = 72%  Patient Saturations on 6 Liters of oxygen while Ambulating = 82%  Please briefly explain why patient needs home oxygen:

## 2013-12-14 NOTE — Evaluation (Signed)
Physical Therapy Evaluation Patient Details Name: [place] Goodman MRN: [id number] DOB: 04-28-1932 Today's Date: 12/14/2013 Time: 3790-2409 PT Time Calculation (min): 27 min  PT Assessment / Plan / Recommendation History of Present Illness   The patient is a 80 y/o male with a history of CAD, status post CABG over 10 years ago x 4 (left internal mammary artery to LAD, saphenous vein graft to first diagonal, saphenous vein graft to OM2, saphenous vein graft to posterior descending). His EF at time of CABG was 30%. He also has PVD, dyslipidemia, hypertension, GERD, and chronic low back pain. He has been seen by Dr. Gwenlyn Found in the past, but has not followed up in several years. He initially presented to [facility] with a complaint of worsening SOB and orthopnea. W/u at [facility] revealed CHF. BNP was elevated at 21,216. CXR showed cardiomegaly with diffuse interstitial edema. A 2D echo demonstrated LVEF to be severely depressed at  Clinical Impression  Pt admitted with with SOB, cp and CHF. Pt currently with functional limitations due to the deficits listed below (see PT Problem List).  Pt will benefit from skilled PT to increase their independence and safety with mobility to allow discharge to home with available assist from his wife.      PT Assessment  Patient needs continued PT services    Follow Up Recommendations  Home health PT;Supervision for mobility/OOB    Does the patient have the potential to tolerate intense rehabilitation      Barriers to Discharge        Equipment Recommendations  None recommended by PT    Recommendations for Other Services     Frequency      Precautions / Restrictions Precautions Precaution Comments: Has just received a life vest   Pertinent Vitals/Pain See qualification note.  89% at rest on RA,  decr to 72% on RA during amb.  Increase to 82% on 6L [place],  Back in bed on 4L he returned to 96%      Mobility  Bed Mobility Bed Mobility: Supine to Sit;Sitting - Scoot  to Edge of Bed;Sit to Supine Supine to Sit: 7: Independent Sitting - Scoot to Edge of Bed: 7: Independent Sit to Supine: 7: Independent Transfers Transfers: Sit to Stand;Stand to Sit Sit to Stand: 6: Modified independent (Device/Increase time);With upper extremity assist;From bed;From chair/3-in-1 Stand to Sit: 6: Modified independent (Device/Increase time);With upper extremity assist;To bed;To chair/3-in-1 Ambulation/Gait Ambulation/Gait Assistance: 5: Supervision Ambulation Distance (Feet): 50 Feet (with one standing rest break) Assistive device: Rolling walker Ambulation/Gait Assistance Details: generally steady, but quick to fatigue with crashing sats. Gait Pattern: Step-through pattern Stairs: No    Exercises     PT Diagnosis: Difficulty walking;Other (comment) (decr activity tolerance)  PT Problem List: Decreased strength;Decreased activity tolerance;Cardiopulmonary status limiting activity;Decreased mobility PT Treatment Interventions: DME instruction;Stair training;Gait training;Patient/family education;Therapeutic activities;Functional mobility training     PT Goals(Current goals can be found in the care plan section) Acute Rehab PT Goals Patient Stated Goal: I just need to get my ability to do activity back PT Goal Formulation: With patient Time For Goal Achievement: 12/21/13 Potential to Achieve Goals: Fair  Visit Information  Last PT Received On: 12/14/13 Assistance Needed: +1 History of Present Illness:  The patient is a 80 y/o male with a history of CAD, status post CABG over 10 years ago x 4 (left internal mammary artery to LAD, saphenous vein graft to first diagonal, saphenous vein graft to OM2, saphenous vein graft to posterior descending). His  EF at time of CABG was 30%. He also has PVD, dyslipidemia, hypertension, GERD, and chronic low back pain. He has been seen by Dr. Gwenlyn Found in the past, but has not followed up in several years. He initially presented to [facility] with  a complaint of worsening SOB and orthopnea. W/u at [facility] revealed CHF. BNP was elevated at 21,216. CXR showed cardiomegaly with diffuse interstitial edema. A 2D echo demonstrated LVEF to be severely depressed at       Prior [place] expects to be discharged to:: Private residence Living Arrangements: Spouse/significant other Available Help at Discharge: Family;Available 24 hours/day Type of Home: Mobile home Home Access: Stairs to enter Entrance Stairs-Number of Steps: 4 Entrance Stairs-Rails: Right;Left Home Layout: One level Home Equipment: Walker - 2 wheels;Shower seat - built in Prior Function Level of Independence: Independent with assistive device(s) Communication Communication: No difficulties    Cognition  Cognition Arousal/Alertness: Awake/alert Overall Cognitive Status: Within Functional Limits for tasks assessed    Extremity/Trunk Assessment Upper Extremity Assessment Upper Extremity Assessment: Overall WFL for tasks assessed Lower Extremity Assessment Lower Extremity Assessment: Generalized weakness;Overall WFL for tasks assessed   Balance Balance Balance Assessed: Yes Static Sitting Balance Static Sitting - Balance Support: No upper extremity supported;Feet supported Static Sitting - Level of Assistance: 7: Independent  End of Session PT - End of Session Activity Tolerance: Other (comment) (mobility limited by decr activity tolerance and decr sats) Patient left: in bed;with call bell/phone within reach;with nursing/sitter in room Nurse Communication: Mobility status  GP     Tymeka Privette, Tessie Fass 12/14/2013, 5:32 PM 12/14/2013  Donnella Sham, PT [phone]  (pager)12/14/2013  Donnella Sham, [place] [phone]  (pager)

## 2013-12-14 NOTE — Progress Notes (Signed)
DAILY PROGRESS NOTE  Subjective:  Still dyspneic with exertion. Creatinine has steadily improved with diuresis. Cardiac output is reduced and he was started on low dose digoxin yesterday.  Plan for d/c home on oxygen due to emphysema and probably interstitial lung disease. He is still awaiting LifeVest placement.  Objective:  Temp:  [97.1 F (36.2 C)-97.8 F (36.6 C)] 97.4 F (36.3 C) (01/01 0557) Pulse Rate:  [71-123] 71 (01/01 0557) Resp:  [20-22] 20 (01/01 0557) BP: (90-112)/(51-84) 101/67 mmHg (01/01 0557) SpO2:  [90 %-96 %] 90 % (01/01 0557) Weight:  [149 lb 4.8 oz (67.722 kg)] 149 lb 4.8 oz (67.722 kg) (01/01 0557) Weight change:   Intake/Output from previous day: 12/31 0701 - 01/01 0700 In: 1080 [P.O.:1080] Out: 850 [Urine:850]  Intake/Output from this shift:    Medications: Current Facility-Administered Medications  Medication Dose Route Frequency Provider Last Rate Last Dose  . 0.9 %  sodium chloride infusion  250 mL Intravenous PRN Pixie Casino, MD      . acetaminophen (TYLENOL) tablet 650 mg  650 mg Oral Q4H PRN Pixie Casino, MD      . albuterol (PROVENTIL) (2.5 MG/3ML) 0.083% nebulizer solution 2.5 mg  2.5 mg Nebulization Q2H PRN Lorretta Harp, MD   2.5 mg at 12/13/13 1239  . ALPRAZolam Duanne Moron) tablet 0.5 mg  0.5 mg Oral TID Thurnell Lose, MD   0.5 mg at 12/08/13 1610  . aspirin EC tablet 81 mg  81 mg Oral Daily Lorretta Harp, MD   81 mg at 12/13/13 1029  . carvedilol (COREG) tablet 6.25 mg  6.25 mg Oral BID WC Pixie Casino, MD   6.25 mg at 12/14/13 0836  . citalopram (CELEXA) tablet 20 mg  20 mg Oral Daily Thurnell Lose, MD   20 mg at 12/13/13 1029  . digoxin (LANOXIN) tablet 0.0625 mg  0.0625 mg Oral Daily Brittainy Simmons, PA-C   0.0625 mg at 12/13/13 1029  . furosemide (LASIX) injection 40 mg  40 mg Intravenous BID Pixie Casino, MD   40 mg at 12/14/13 0837  . guaiFENesin-dextromethorphan (ROBITUSSIN DM) 100-10 MG/5ML syrup 5 mL   5 mL Oral Q4H PRN Thurnell Lose, MD      . heparin injection 5,000 Units  5,000 Units Subcutaneous Q8H Thurnell Lose, MD   5,000 Units at 12/14/13 0608  . HYDROcodone-acetaminophen (NORCO/VICODIN) 5-325 MG per tablet 1-2 tablet  1-2 tablet Oral Q4H PRN Thurnell Lose, MD      . insulin aspart (novoLOG) injection 0-9 Units  0-9 Units Subcutaneous TID WC Thurnell Lose, MD   1 Units at 12/13/13 1745  . lisinopril (PRINIVIL,ZESTRIL) tablet 2.5 mg  2.5 mg Oral Daily Brittainy Simmons, PA-C   2.5 mg at 12/13/13 1029  . megestrol (MEGACE) 400 MG/10ML suspension 200 mg  200 mg Oral Daily Thurnell Lose, MD   200 mg at 12/13/13 1030  . ondansetron (ZOFRAN) tablet 4 mg  4 mg Oral Q6H PRN Thurnell Lose, MD       Or  . ondansetron (ZOFRAN) injection 4 mg  4 mg Intravenous Q6H PRN Thurnell Lose, MD      . ondansetron (ZOFRAN) injection 4 mg  4 mg Intravenous Q6H PRN Pixie Casino, MD      . pantoprazole (PROTONIX) EC tablet 80 mg  80 mg Oral Q1200 Thurnell Lose, MD   80 mg at 12/13/13 1212  . sodium chloride  0.9 % injection 3 mL  3 mL Intravenous Q12H Thurnell Lose, MD   3 mL at 12/11/13 2117  . sodium chloride 0.9 % injection 3 mL  3 mL Intravenous Q12H Pixie Casino, MD   3 mL at 12/13/13 2230  . sodium chloride 0.9 % injection 3 mL  3 mL Intravenous PRN Pixie Casino, MD      . zolpidem Lorrin Mais) tablet 5 mg  5 mg Oral QHS PRN Thurnell Lose, MD   5 mg at 12/13/13 2228    Physical Exam: General appearance: alert and mild distress Neck: no carotid bruit and no JVD Lungs: diminished breath sounds bilaterally Heart: regular rate and rhythm Abdomen: soft, non-tender; bowel sounds normal; no masses,  no organomegaly Extremities: extremities normal, atraumatic, no cyanosis or edema Pulses: 2+ and symmetric Skin: Skin color, texture, turgor normal. No rashes or lesions Neurologic: Mental status: Alert, oriented, thought content appropriate Psych: Pleasant, normal  Lab  Results: Results for orders placed during the hospital encounter of 12/07/13 (from the past 48 hour(s))  GLUCOSE, CAPILLARY     Status: Abnormal   Collection Time    12/12/13  4:17 PM      Result Value Range   Glucose-Capillary 114 (*) 70 - 99 mg/dL  GLUCOSE, CAPILLARY     Status: Abnormal   Collection Time    12/12/13  9:35 PM      Result Value Range   Glucose-Capillary 196 (*) 70 - 99 mg/dL  BASIC METABOLIC PANEL     Status: Abnormal   Collection Time    12/13/13  5:32 AM      Result Value Range   Sodium 144  137 - 147 mEq/L   Comment: Please note change in reference range.   Potassium 3.9  3.7 - 5.3 mEq/L   Comment: Please note change in reference range.   Chloride 101  96 - 112 mEq/L   CO2 29  19 - 32 mEq/L   Glucose, Bld 128 (*) 70 - 99 mg/dL   BUN 27 (*) 6 - 23 mg/dL   Creatinine, Ser 1.09  0.50 - 1.35 mg/dL   Calcium 8.7  8.4 - 10.5 mg/dL   GFR calc non Af Amer 63 (*) >90 mL/min   GFR calc Af Amer 73 (*) >90 mL/min   Comment: (NOTE)     The eGFR has been calculated using the CKD EPI equation.     This calculation has not been validated in all clinical situations.     eGFR's persistently <90 mL/min signify possible Chronic Kidney     Disease.  GLUCOSE, CAPILLARY     Status: Abnormal   Collection Time    12/13/13  6:22 AM      Result Value Range   Glucose-Capillary 123 (*) 70 - 99 mg/dL  GLUCOSE, CAPILLARY     Status: Abnormal   Collection Time    12/13/13 11:30 AM      Result Value Range   Glucose-Capillary 116 (*) 70 - 99 mg/dL  SEDIMENTATION RATE     Status: Abnormal   Collection Time    12/13/13  1:50 PM      Result Value Range   Sed Rate 35 (*) 0 - 16 mm/hr  RHEUMATOID FACTOR     Status: Abnormal   Collection Time    12/13/13  1:50 PM      Result Value Range   Rheumatoid Factor 34 (*) <=14 IU/mL   Comment: (NOTE)  Interpretive Table                        Low Positive: 15 - 41 IU/mL                        High Positive:  >=  42 IU/mL     In addition to the RF result, and clinical symptoms including joint     involvement, the 2010 ACR Classification Criteria for     scoring/diagnosing Rheumatoid Arthritis include the results of the     following tests:  CRP (85631), ESR (15010), and CCP ([facility]) (49702).     www.rheumatology.org/practice/clinical/classification/ra/ra_2010.asp     Performed at [place], CAPILLARY     Status: Abnormal   Collection Time    12/13/13  4:27 PM      Result Value Range   Glucose-Capillary 136 (*) 70 - 99 mg/dL   Comment 1 Documented in Chart     Comment 2 Notify RN    GLUCOSE, CAPILLARY     Status: Abnormal   Collection Time    12/13/13  9:16 PM      Result Value Range   Glucose-Capillary 104 (*) 70 - 99 mg/dL   Comment 1 Documented in Chart     Comment 2 Notify RN    GLUCOSE, CAPILLARY     Status: Abnormal   Collection Time    12/14/13  6:00 AM      Result Value Range   Glucose-Capillary 114 (*) 70 - 99 mg/dL    Imaging: Dg Chest 2 View  12/12/2013   CLINICAL DATA:  Emphysema, CHF, desaturation overnight  EXAM: CHEST  2 VIEW  COMPARISON:  12/07/2013  FINDINGS: Low lung volumes. Cardiac silhouette is enlarged. There is diffuse thickening of interstitial markings slightly less prominent when compared to previous study. Areas peribronchial cuffing are appreciated small is indistinctness of the pulmonary vasculature. No focal regions of consolidation or focal infiltrates. Patient is status post median sternotomy coronary artery bypass grafting. The osseous structures are unremarkable.  IMPRESSION: Shallow inspiration.Improved interstitial infiltrate consistent with improved pulmonary edema. Underlying component of pulmonary fibrosis is also a diagnostic consideration. Atherosclerotic calcifications are appreciated.   Electronically Signed   By: Margaree Mackintosh M.D.   On: 12/12/2013 10:14    Assessment:  1. Principal Problem: 2.   Acute on chronic systolic heart  failure 3. Active Problems: 4.   GERD (gastroesophageal reflux disease) 5.   High cholesterol 6.   Essential hypertension, benign 7.   Coronary artery disease 8.   PVD (peripheral vascular disease) 9.   SOB (shortness of breath) 10.   Pulmonary edema 11.   CHF (congestive heart failure) 12.   Bullous emphysema 13.   IPF (idiopathic pulmonary fibrosis) 14.   Plan:  1. Mr. Gullett is probably optimized at this point from a congestive heart failure standpoint. I suspect his persistent hypoxia and DOE is related to bullous emphysema and interstitial fibrosis. Appreciate pulmonary recommendations. Will be discharged home on oxygen.Marland Kitchen Awaiting lifevest placement for discharge. Switch to po diuretics today. Hopeful dc later today if we can get LifeVest fitted.  Time Spent Directly with Patient:  15 minutes  Length of Stay:  LOS: 7 days   Pixie Casino, MD, [facility] Attending Cardiologist [facility]  Phillis Thackeray C 12/14/2013, 8:57 AM

## 2013-12-15 ENCOUNTER — Other Ambulatory Visit: Payer: Self-pay | Admitting: Cardiology

## 2013-12-15 ENCOUNTER — Encounter (HOSPITAL_COMMUNITY): Payer: Self-pay | Admitting: Cardiology

## 2013-12-15 DIAGNOSIS — I420 Dilated cardiomyopathy: Secondary | ICD-10-CM

## 2013-12-15 DIAGNOSIS — E8881 Metabolic syndrome: Secondary | ICD-10-CM

## 2013-12-15 DIAGNOSIS — I255 Ischemic cardiomyopathy: Secondary | ICD-10-CM

## 2013-12-15 DIAGNOSIS — I25810 Atherosclerosis of coronary artery bypass graft(s) without angina pectoris: Secondary | ICD-10-CM

## 2013-12-15 DIAGNOSIS — Z9189 Other specified personal risk factors, not elsewhere classified: Secondary | ICD-10-CM

## 2013-12-15 DIAGNOSIS — J439 Emphysema, unspecified: Secondary | ICD-10-CM

## 2013-12-15 DIAGNOSIS — I4729 Other ventricular tachycardia: Secondary | ICD-10-CM | POA: Diagnosis not present

## 2013-12-15 DIAGNOSIS — G4730 Sleep apnea, unspecified: Secondary | ICD-10-CM

## 2013-12-15 DIAGNOSIS — R0902 Hypoxemia: Secondary | ICD-10-CM

## 2013-12-15 DIAGNOSIS — I472 Ventricular tachycardia: Secondary | ICD-10-CM | POA: Diagnosis not present

## 2013-12-15 HISTORY — DX: Hypoxemia: R09.02

## 2013-12-15 HISTORY — DX: Other specified personal risk factors, not elsewhere classified: Z91.89

## 2013-12-15 HISTORY — DX: Metabolic syndrome: E88.810

## 2013-12-15 HISTORY — DX: Dilated cardiomyopathy: I42.0

## 2013-12-15 HISTORY — DX: Atherosclerosis of coronary artery bypass graft(s) without angina pectoris: I25.810

## 2013-12-15 HISTORY — DX: Dilated cardiomyopathy: I25.5

## 2013-12-15 HISTORY — DX: Metabolic syndrome: E88.81

## 2013-12-15 LAB — CYCLIC CITRUL PEPTIDE ANTIBODY, IGG: Cyclic Citrullin Peptide Ab: <2 U/mL (ref 0.0–5.0)

## 2013-12-15 LAB — BASIC METABOLIC PANEL
BUN: 34 mg/dL — ABNORMAL HIGH (ref 6–23)
CO2: 28 mEq/L (ref 19–32)
CREATININE: 1.11 mg/dL (ref 0.50–1.35)
Calcium: 8.5 mg/dL (ref 8.4–10.5)
Chloride: 99 mEq/L (ref 96–112)
GFR, EST AFRICAN AMERICAN: 71 mL/min — AB (ref >90)
GFR, EST NON AFRICAN AMERICAN: 61 mL/min — AB (ref >90)
GLUCOSE: 116 mg/dL — AB (ref 70–99)
POTASSIUM: 3.6 meq/L — AB (ref 3.7–5.3)
Sodium: 142 mEq/L (ref 137–147)

## 2013-12-15 LAB — GLUCOSE, CAPILLARY
GLUCOSE-CAPILLARY: 135 mg/dL — AB (ref 70–99)
Glucose-Capillary: 108 mg/dL — ABNORMAL HIGH (ref 70–99)

## 2013-12-15 LAB — ANA: Anti Nuclear Antibody(ANA): NEGATIVE

## 2013-12-15 MED ORDER — POTASSIUM CHLORIDE CRYS ER 10 MEQ PO TBCR: 10.0000 meq | EXTENDED_RELEASE_TABLET | Freq: Every day | ORAL | Status: DC

## 2013-12-15 MED ORDER — POTASSIUM CHLORIDE CRYS ER 20 MEQ PO TBCR
40.0000 meq | EXTENDED_RELEASE_TABLET | Freq: Once | ORAL | Status: AC
  Administered 2013-12-15: 40 meq via ORAL
  Filled 2013-12-15: qty 2

## 2013-12-15 MED ORDER — CITALOPRAM HYDROBROMIDE 20 MG PO TABS: 20.0000 mg | ORAL_TABLET | Freq: Every day | ORAL | Status: AC

## 2013-12-15 MED ORDER — POTASSIUM CHLORIDE CRYS ER 10 MEQ PO TBCR: 10.0000 meq | EXTENDED_RELEASE_TABLET | Freq: Every day | ORAL | Status: AC

## 2013-12-15 NOTE — Progress Notes (Signed)
NP returned page and requested RN page MD; MD paged X2; still awaiting callback.

## 2013-12-15 NOTE — Progress Notes (Signed)
BP low and pt dizzy.  I stopped lisinopril.  Pt better now and ready for discharge.

## 2013-12-15 NOTE — Progress Notes (Signed)
Pt. Seen and examined. Agree with the NP/PA-C note as written.  Breathing still somewhat labored. Will d/c on 4L home O2 for persistent hypoxemia due to pulmonary fibrosis / emphysema. Follow-up with pulmonary arranged as well as outpatient sleep study. Maintaining net negative on current diuretic dose 40 mg PO BID. Tolerating low dose digoxin and addition of ACE-I. On coreg 6.25 mg BID.  LifeVest in place and he has been instructed on its use. Plan d/c home today with home PT.  Follow-up with Dr. Gwenlyn Found or APP in 5-7 days (TCM 7) follow-up, due to significant, advanced heart failure with higher likelihood of decompensation.   Pixie Casino, MD, [facility] Attending Cardiologist [place]

## 2013-12-15 NOTE — Progress Notes (Addendum)
Pt up to BR with NT; pt c/o feeling lightheaded; SBP 80; pt to d/c home with wife; wife just arrived; will page MD to make aware; will await callback.

## 2013-12-15 NOTE — Discharge Summary (Signed)
Pt. Seen and examined. Agree with the NP/PA-C note as written.  Soft blood pressure today due to addition of ACE-I. This has been held. On discharge, systolic BP was 90 and he was asymptomatic.  Pixie Casino, MD, [facility] Attending Cardiologist [place]

## 2013-12-15 NOTE — Progress Notes (Signed)
Spoke with NP; meds to be changed for d/c home; will review meds with pt and wife; pt to d/c home with wife.

## 2013-12-15 NOTE — Progress Notes (Signed)
Subjective: Pt recommends home PT.  SP02 down to 79% with ambulation on RA  Objective: Vital signs in last 24 hours: Temp:  [97.5 F (36.4 C)-98 F (36.7 C)] 97.9 F (36.6 C) (01/02 0500) Pulse Rate:  [68-93] 93 (01/02 0500) Resp:  [20] 20 (01/02 0500) BP: (91-137)/(60-82) 108/82 mmHg (01/02 0500) SpO2:  [89 %-94 %] 94 % (01/02 0500) Weight:  [149 lb 14.6 oz (68 kg)] 149 lb 14.6 oz (68 kg) (01/02 0500) Weight change: 9.8 oz (0.278 kg) Last BM Date: 12/14/13 Intake/Output from previous day:  -430 ( -1175 since admit)  Wt 149.14 up 10 oz from yesterday on po lasix. 01/01 0701 - 01/02 0700 In: 480 [P.O.:480] Out: 1050 [Urine:1050] Intake/Output this shift: Total I/O In: 60 [P.O.:60] Out: 200 [Urine:200]  PE: General:Pleasant affect, NAD Skin:Warm and dry, brisk capillary refill HEENT:normocephalic, sclera clear, mucus membranes moist Heart:S1S2 RRR without murmur, gallup, rub or click Lungs:without rales, + rhonchi, or wheezes ZOX:WRUE, non tender, + BS, do not palpate liver spleen or masses Ext:no lower ext edema,  Neuro:alert and oriented, MAE, follows commands, + facial symmetry   Lab Results: No results found for this basename: WBC, HGB, HCT, PLT,  in the last 72 hours BMET  Recent Labs  12/14/13 0500 12/15/13 0520  NA 141 142  K 3.7 3.6*  CL 99 99  CO2 27 28  GLUCOSE 117* 116*  BUN 32* 34*  CREATININE 1.11 1.11  CALCIUM 9.0 8.5   Lab Results  Component Value Date   HGBA1C 6.4* 12/07/2013     No results found for this basename: TSH     Studies/Results: No results found.  Medications: I have reviewed the patient's current medications. Scheduled Meds: . ALPRAZolam  0.5 mg Oral TID  . aspirin EC  81 mg Oral Daily  . carvedilol  6.25 mg Oral BID WC  . citalopram  20 mg Oral Daily  . digoxin  0.0625 mg Oral Daily  . furosemide  40 mg Oral BID  . heparin  5,000 Units Subcutaneous Q8H  . insulin aspart  0-9 Units Subcutaneous TID WC  .  lisinopril  2.5 mg Oral Daily  . megestrol  200 mg Oral Daily  . pantoprazole  80 mg Oral Q1200  . sodium chloride  3 mL Intravenous Q12H  . sodium chloride  3 mL Intravenous Q12H   Continuous Infusions:  PRN Meds:.sodium chloride, acetaminophen, albuterol, guaiFENesin-dextromethorphan, HYDROcodone-acetaminophen, ondansetron (ZOFRAN) IV, ondansetron (ZOFRAN) IV, ondansetron, sodium chloride, zolpidem  Assessment/Plan: Principal Problem:   Acute on chronic systolic heart failure Active Problems:   GERD (gastroesophageal reflux disease)   High cholesterol   Essential hypertension, benign   Coronary artery disease   PVD (peripheral vascular disease)   SOB (shortness of breath)   Pulmonary edema   CHF (congestive heart failure)   Bullous emphysema   IPF (idiopathic pulmonary fibrosis)  PLAN:  Will discharge with home oxygen 24/7 at 4 L, Home PT , lifevest in place K+ level a little low- will replace and add 10 meq daily -assess for O2 needs prior to d/c, anticipate he will need for discharge  -PFTs as outpt -then decide on bronchiodilators  -obtain basic serology -ANA, ESR, RA factor & CCP, no evidence of collagen vascular disease RA elevated -Sleep study as out pt to r/o central sleep apnea with systolic heart failure -appt arranged  -mobilize as able   LOS: 8 days   Time spent with pt. :  15 minutes. [facility] R  Nurse Practitioner Certified Pager [phone] or after 5pm and on weekends call [phone] 12/15/2013, 8:57 AM

## 2013-12-15 NOTE — Discharge Summary (Signed)
Physician Discharge Summary       Patient ID: Taylor Goodman MRN: [id number] DOB/AGE: Jan 06, 1932 80 y.o.  Admit date: 12/07/2013 Discharge date: 12/15/2013  Discharge Diagnoses:  Principal Problem:   Acute on chronic systolic heart failure Active Problems:   At risk for sudden cardiac death, EF 15-20%, life vest in place    Ischemic dilated cardiomyopathy EF 15-20% by Echo and cath 11/2013   Hypoxia, discharged with home oxygen at 4L [place]   CAD (coronary artery disease) of artery bypass graft, occluded VG-PDA & VG-OM; Patent LIMA-LAD & VG-diag 11/2013   GERD (gastroesophageal reflux disease)   High cholesterol   Essential hypertension, benign   Coronary artery disease, hx CABG in the 90s   PVD (peripheral vascular disease)   SOB (shortness of breath)   Pulmonary edema   Bullous emphysema   IPF (idiopathic pulmonary fibrosis)   Metabolic syndrome with XTKW4O of 6.4    NSVT (nonsustained ventricular tachycardia)   Discharged Condition: fair  Procedures: cardiac cath by Dr. Debara Pickett 12/11/13  Hospital Course: 80 y/o male with a history of CAD, status post CABG over 10 years ago x 4 (left internal mammary artery to LAD, saphenous vein graft to first diagonal, saphenous vein graft to OM2, saphenous vein graft to posterior descending). His EF at time of CABG was 30%. He also has PVD, dyslipidemia, hypertension, GERD, and chronic low back pain. He has been seen by Dr. Gwenlyn Found in the past, but had not followed up in several years. He initially presented to [facility] 12/07/13 with a complaint of worsening SOB and orthopnea. W/u at [facility] revealed CHF. BNP was elevated at 21,216. CXR showed cardiomegaly with diffuse interstitial edema. A 2D echo demonstrated LVEF to be severely depressed at approximately 15 to 20% with diffuse hypokinesis, inferior/posterior akinesis. The cavity size was severely dilated. Wall thickness was increased in a pattern of mild LVH. There was moderate MR. Cardiac enzymes were  cycled and were negative x 3. He was given IV Lasix and [facility] and was transferred to [facility] for further treatment.  Pt was diuresed and by 12/11/13 was ready for cardiac cath.  He had 3 vessel disease and 2 of his VGs were occluded.  EF was 15-20% with moderate to severely reduced CO with elevated Lt and Rt heart filling pressures.  Elevated SVR and PVR.  Medical therapy recommended.  Pt was placed back on IV lasix and  Pul consult was obtained due to pt developing desat/ hypoxia and NSVT.   CT of chest from [facility] demonstrates severe bullous emphysema, with fibrotic changes.   Dr. Elsworth Soho favored IPF of interstitial lung disease.    Pt continued to improve.  PT saw and evaluated and need for home PT was established for weakness, SOB and hypoxia.  Home oxygen was arranged due SP02 on RA with exertion of 79%.    By the AM of 12/15/13 pt was stable for discharge but would need help at home with PT and oxygen.  His glucose was noted to be elevated and diabetic classes were arranged at [facility]  He will be called Monday to be sure he is stable and follow up next week.  He will need Dig level and BMP on Thursday of next week.  He was seen and evaluated by Dr. Debara Pickett and found stable for discharge.  Just prior to discharge his BP dropped to the 80s and he was dizzy with rest his BP improved and he was ready for discharge.  Consults: pulmonary/intensive care  Significant Diagnostic Studies:  BMET    Component Value Date/Time   NA 142 12/15/2013 0520   K 3.6* 12/15/2013 0520   CL 99 12/15/2013 0520   CO2 28 12/15/2013 0520   GLUCOSE 116* 12/15/2013 0520   BUN 34* 12/15/2013 0520   CREATININE 1.11 12/15/2013 0520   CREATININE 0.80 08/07/2013 1034   CALCIUM 8.5 12/15/2013 0520   GFRNONAA 61* 12/15/2013 0520   GFRAA 71* 12/15/2013 0520    CBC    Component Value Date/Time   WBC 7.9 12/12/2013 0625   RBC 4.32 12/12/2013 0625   HGB 13.1 12/12/2013 0625   HCT 39.7 12/12/2013 0625   PLT 195 12/12/2013 0625   MCV  91.9 12/12/2013 0625   MCH 30.3 12/12/2013 0625   MCHC 33.0 12/12/2013 0625   RDW 15.1 12/12/2013 0625   LYMPHSABS 2.3 05/05/2013 0855   MONOABS 0.7 05/05/2013 0855   EOSABS 0.2 05/05/2013 0855   BASOSABS 0.0 05/05/2013 0855    BNP (last 3 results)  Recent Labs  12/07/13 1138 12/10/13 1020  PROBNP 21216.0* 22359.0*    Sed rate 35 AANA negative RA 34 low positive   CXR:  COMPARISON: 12/07/2013 FINDINGS: Low lung volumes. Cardiac silhouette is enlarged. There is diffuse thickening of interstitial markings slightly less prominent when compared to previous study. Areas peribronchial cuffing are appreciated small is indistinctness of the pulmonary vasculature. No focal regions of consolidation or focal infiltrates. Patient is status post median sternotomy coronary artery bypass grafting. The osseous structures are unremarkable. IMPRESSION: Shallow inspiration.Improved interstitial infiltrate consistent with improved pulmonary edema. Underlying component of pulmonary fibrosis is also a diagnostic consideration. Atherosclerotic calcifications are appreciated  2D Echo: Left ventricle: LVEF is severely depressed at approximately 15 to 20% with diffuse hypokinesis, inferior/posterior akinesis. The cavity size was severely dilated. Wall thickness was increased in a pattern of mild LVH. - Mitral valve: Moderate regurgitation. - Right ventricle: The cavity size was moderately dilated. Systolic function was severely reduced. - Right atrium: The atrium was mildly dilated.    Discharge Exam: Blood pressure 80/56, pulse 89, temperature 97 F (36.1 C), temperature source Oral, resp. rate 21, height 5\' 7"  (1.702 m), weight 149 lb 14.6 oz (68 kg), SpO2 91.00%.   PE: General:Pleasant affect, NAD  Skin:Warm and dry, brisk capillary refill  HEENT:normocephalic, sclera clear, mucus membranes moist  Heart:S1S2 RRR without murmur, gallup, rub or click  Lungs:without rales, + rhonchi, or  wheezes  [id number], non tender, + BS, do not palpate liver spleen or masses  Ext:no lower ext edema,  Neuro:alert and oriented, MAE, follows commands, + facial symmetry  Disposition: 01-Home or Self Care      Discharge Orders   Future Appointments Provider Department Dept Phone   12/18/2013 12:00 PM [place], [place] [id number]   12/21/2013 11:20 AM [facility] [id number]   12/21/2013 8:00 PM [place] [id number]   12/27/2013 2:30 PM [facility] [address] RESPIRATORY THERAPY [id number]   12/27/2013 3:00 PM [facility] [place] RESPIRATORY THERAPY [phone]   Future Orders Complete By Expires   Face-to-face encounter (required for Medicare/Medicaid patients)  As directed    Comments:     I Samuella Rasool R certify that this patient is under my care and that I, or a nurse practitioner or physician's assistant working with me, had a face-to-face encounter that meets the physician face-to-face encounter requirements with this  patient on 12/15/2013. The encounter with the patient was in whole, or in part for the following medical condition(s) which is the primary reason for home health care (List medical condition): respiratory failure, COPD, significant CAD.   Questions:     The encounter with the patient was in whole, or in part, for the following medical condition, which is the primary reason for home health care:  respiratory failure, chf, cad   I certify that, based on my findings, the following services are medically necessary home health services:  Nursing   Physical therapy   My clinical findings support the need for the above services:  Unable to leave home safely without assistance and/or assistive device   Further, I certify that my clinical findings support that this patient is homebound due to:  Ambulates short distances less than 300 feet   Reason for  Medically Necessary Home Health Services:  Skilled Nursing- Changes in Medication/Medication Management   Therapy- Therapeutic Exercises to Increase Strength and Endurance   For home use only DME oxygen  As directed    Comments:     Needs at home and ambulatory to go to appts.   Questions:     Mode or (Route):  Nasal cannula   Liters per Minute:  4   Frequency:  Continuous (stationary and portable oxygen unit needed)   Oxygen conserving device:  Yes   Home Health  As directed    Questions:     To provide the following care/treatments:  PT   OT   RN       Medication List    STOP taking these medications       amLODipine 5 MG tablet  Commonly known as:  NORVASC     azithromycin 250 MG tablet  Commonly known as:  ZITHROMAX     simvastatin 40 MG tablet  Commonly known as:  ZOCOR      TAKE these medications       ALPRAZolam 0.5 MG tablet  Commonly known as:  XANAX  Take 1 tablet (0.5 mg total) by mouth 3 (three) times daily as needed for anxiety.     aspirin EC 81 MG tablet  Take 81 mg by mouth daily.     carvedilol 6.25 MG tablet  Commonly known as:  COREG  Take 1 tablet (6.25 mg total) by mouth 2 (two) times daily with a meal.     citalopram 20 MG tablet  Commonly known as:  CELEXA  Take 1 tablet (20 mg total) by mouth daily.     Digoxin 62.5 MCG Tabs  Take 0.0625 mg by mouth daily.     furosemide 40 MG tablet  Commonly known as:  LASIX  Take 1 tablet (40 mg total) by mouth 2 (two) times daily.     guaiFENesin-dextromethorphan 100-10 MG/5ML syrup  Commonly known as:  ROBITUSSIN DM  Take 5 mLs by mouth every 4 (four) hours as needed for cough.     HYDROcodone-acetaminophen 10-650 MG per tablet  Commonly known as:  LORCET  Take 0.05-1 tablets by mouth 2 (two) times daily as needed. Pain     megestrol 40 MG/ML suspension  Commonly known as:  MEGACE  Take 5 mLs (200 mg total) by mouth daily.     omeprazole 20 MG tablet  Commonly known as:  PRILOSEC OTC    Take 20 mg by mouth daily.     potassium chloride 10 MEQ tablet  Commonly known as:  K-DUR,KLOR-CON  Take  1 tablet (10 mEq total) by mouth daily.  Start taking on:  12/16/2013     zolpidem 10 MG tablet  Commonly known as:  AMBIEN  Take 1 tablet by mouth at bedtime as needed for sleep.       Follow-up Information   Follow up with [facility] On 12/21/2013.   Specialty:  Sleep Medicine   Contact information:   [address] [id number] [place] [address] [phone]      Follow up with [facility], MD.   Specialty:  Pulmonary Disease   Contact information:   [place] [address] [phone]       Follow up with Tarri Fuller, PA-C On 12/21/2013. (at 11:20 AM)    Specialty:  Physician Assistant   Contact information:   [address] [place] [address] [place] [address] [phone]        Discharge Instructions: Weigh daily Call [phone] if weight climbs more than 3 pounds in a day or 5 pounds in a week. No salt to very little salt in your diet.  No more than 2000 mg in a day. Call if increased shortness of breath or increased swelling.   Watch sugar in your diet.  Decrease sweets, white bread, white rice or potatoes, better to use brown rice.  You have appts at [facility] for borderline diabetes.   Follow up as instructed.  Wear oxygen at all times.  We will schedule you a sleep study.  Also PFTs- pulmonary function studies.  Both should be scheduled at [place]: [facility]: [facility] 12/15/2013, 3:12 PM  Time spent on discharge :50 minutes.

## 2013-12-15 NOTE — Progress Notes (Signed)
Pt and wife given d/c instructions; IV and tele monitor d/c at this time; both verbalized understanding; will cont. To monitor.

## 2013-12-15 NOTE — Discharge Instructions (Signed)
Weigh daily Call [phone] if weight climbs more than 3 pounds in a day or 5 pounds in a week. No salt to very little salt in your diet.  No more than 2000 mg in a day. Call if increased shortness of breath or increased swelling.   Watch sugar in your diet.  Decrease sweets, white bread, white rice or potatoes, better to use brown rice.  You have appts at [facility] for borderline diabetes.   Follow up as instructed.  Wear oxygen at all times.  We will schedule you a sleep study.  Also PFTs- pulmonary function studies.  Both should be scheduled at [facility]

## 2013-12-15 NOTE — Discharge Summary (Signed)
Addendum  BP at discharge 70/ systolic and no further dizziness

## 2013-12-15 NOTE — Care Management Note (Signed)
    Page 1 of 2   12/15/2013     4:10:05 PM   CARE MANAGEMENT NOTE 12/15/2013  Patient:  Taylor Goodman, Taylor Goodman   Account Number:  [id number]  Date Initiated:  12/15/2013  Documentation initiated by:  Davin Muramoto  Subjective/Objective Assessment:   PT ADM ON 12/25 WITH CHF EXACERBATION; EF 15%.  PTA, PT RESIDES AT [facility].     Action/Plan:   PT FOR DC HOME TODAY; PT NEEDS HOME OXYGEN AND HH FOLLOW UP.  PT/WIFE WISH TO USE [facility] FOR HH NEEDS.   Anticipated DC Date:  12/15/2013   Anticipated DC Plan:  [place]  CM consult      [place] [facility] Choice  HOME HEALTH   Choice offered to / List presented to:  C-1 Patient   DME arranged  [facility]      DME agency  [place] arranged  HH-1 RN  [place].   Status of service:  Completed, signed off Medicare Important Message given?   (If response is "NO", the following Medicare IM given date fields will be blank) Date Medicare IM given:   Date Additional Medicare IM given:    Discharge Disposition:  [place]  Per UR Regulation:  Reviewed for med. necessity/level of care/duration of stay  If discussed at [place] of Stay Meetings, dates discussed:    Comments:  12/15/13 Antavious Spanos,RN,BSN [phone] REFERRAL TO [place] AND DME NEEDS; START OF CARE 24-48H POST DC DATE FOR [place].

## 2013-12-18 ENCOUNTER — Telehealth: Payer: Self-pay | Admitting: Cardiology

## 2013-12-18 ENCOUNTER — Telehealth: Payer: Self-pay | Admitting: Internal Medicine

## 2013-12-18 ENCOUNTER — Ambulatory Visit: Payer: Medicare Other | Admitting: Cardiology

## 2013-12-18 ENCOUNTER — Telehealth: Payer: Self-pay | Admitting: *Deleted

## 2013-12-18 ENCOUNTER — Telehealth: Payer: Self-pay | Admitting: Cardiovascular Disease

## 2013-12-18 DIAGNOSIS — Z79899 Other long term (current) drug therapy: Secondary | ICD-10-CM

## 2013-12-18 NOTE — Telephone Encounter (Signed)
Returned call.  Left message to call back before 4pm.  NEED REASON FOR REQUEST FOR SOCIAL WORK.

## 2013-12-18 NOTE — Telephone Encounter (Signed)
Wants to get order from Dr Gwenlyn Found for Taylor Goodman.  Please call.

## 2013-12-18 NOTE — Telephone Encounter (Signed)
Message copied by Chauncy Lean on Mon Dec 18, 2013 11:24 AM ------      Message from: Isaiah Serge      Created: Fri Dec 15, 2013 10:10 AM       My Curt Bears could you order Mr. Rathke BMP and Dig level for next week?  He could have done before seeing [facility] on Thursday.  Thank you. ------

## 2013-12-18 NOTE — Telephone Encounter (Signed)
Pt called back. He says he is weak but doing OK. He will keep his appointment Thursday. He can't afford Megace.  Kerin Ransom PA-C 12/18/2013 3:39 PM

## 2013-12-18 NOTE — Telephone Encounter (Signed)
Miranda, [facility] nurse w/ [facility] called back.  Wanted to know if a Environmental consultant can be added.  Stated pt/wife is having problems getting groceries in the home.  Stated wife told her their daughter had to buy groceries and pt's meds when discharged from the hospital.  Miranda informed RN will notify provider as pt has not established care w/ a cardiologist at this time and RN will call back once response given.  Verbalized understanding and agreed w/ plan.  Kerin Ransom, PA-C notified and VO given for [place] Social Worker.    Call to [facility] and VO given.  Verbalized understanding.

## 2013-12-18 NOTE — Telephone Encounter (Signed)
No need for message. °

## 2013-12-18 NOTE — Telephone Encounter (Signed)
Home line busy, left message on his cell.  Kerin Ransom PA-C 12/18/2013 3:31 PM

## 2013-12-18 NOTE — Telephone Encounter (Signed)
I spoke with Miranda the [facility] nurse and asked if [facility] could draw the labs.  She said that they could and asked me to fax in the order.  Order was faxed for BMP and dig level.

## 2013-12-20 ENCOUNTER — Telehealth: Payer: Self-pay | Admitting: Internal Medicine

## 2013-12-20 MED ORDER — LEVOFLOXACIN 500 MG PO TABS: 500.0000 mg | ORAL_TABLET | Freq: Every day | ORAL | Status: AC

## 2013-12-20 NOTE — Telephone Encounter (Signed)
ATC spouse x2 - line busy.  WCB.

## 2013-12-20 NOTE — Telephone Encounter (Signed)
Dr Elsworth Soho saw this patient inpatient 12/06/13 and 12/07/13. I have never see the man. He is to fu with me for Pulmonary Fibrosis. There are lot of questions that are hard to answer and support without even having seen him. I suggest ok to give  take levaquin 500mg  once daily  X 5 days and if they want I can do a quick urgent focussed visit tomorrow ibn morning before 11am only. Let me know.   Dr. Brand Males, M.D., [facility].C.P Pulmonary and Critical Care Medicine Staff Physician [place] [facility] Pager: [phone], If no answer or between  15:00h - 7:00h: call [phone]  12/20/2013 2:47 PM

## 2013-12-20 NOTE — Telephone Encounter (Signed)
Sounds like they need to go to ER  Dr. Brand Males, M.D., [facility].C.P Pulmonary and Critical Care Medicine Staff Physician [place] [facility] and Critical Care Pager: [phone], If no answer or between  15:00h - 7:00h: call [phone]  12/20/2013 3:58 PM

## 2013-12-20 NOTE — Telephone Encounter (Signed)
Called and spoke with pts wife and she stated that the pt has appt with MR on 1/28 per hospital instructions.  She stated that the pt has been having a non productive cough that is worse in the last 2 days.  She stated that she has been giving him the robutissin dm but this does not seem to be helping.  They feel he may need abx and the pts wife stated that his PCP would not give abx to him.    She stated that he has [facility] that send out PT/OT and they do RN visits and pt is scheduled at [facility] on 1/14 for PFT and abg.  pts wife wanted to know if pt should have home RT visits as well.  She stated that he is scheduled for a sleep study tomorrow but she called and will reschedule this due to the pt is not able to walk much and they do not have a wheelchair at home to help get the pt in and out of the house.  MR please advise. Thanks  No Known Allergies   Current Outpatient Prescriptions on File Prior to Visit  Medication Sig Dispense Refill  . ALPRAZolam (XANAX) 0.5 MG tablet Take 1 tablet (0.5 mg total) by mouth 3 (three) times daily as needed for anxiety.  30 tablet  0  . aspirin EC 81 MG tablet Take 81 mg by mouth daily.      . carvedilol (COREG) 6.25 MG tablet Take 1 tablet (6.25 mg total) by mouth 2 (two) times daily with a meal.  60 tablet  6  . citalopram (CELEXA) 20 MG tablet Take 1 tablet (20 mg total) by mouth daily.  30 tablet  5  . digoxin 62.5 MCG TABS Take 0.0625 mg by mouth daily.  30 tablet  6  . furosemide (LASIX) 40 MG tablet Take 1 tablet (40 mg total) by mouth 2 (two) times daily.  60 tablet  6  . guaiFENesin-dextromethorphan (ROBITUSSIN DM) 100-10 MG/5ML syrup Take 5 mLs by mouth every 4 (four) hours as needed for cough.  118 mL  0  . HYDROcodone-acetaminophen (LORCET) 10-650 MG per tablet Take 0.05-1 tablets by mouth 2 (two) times daily as needed. Pain      . megestrol (MEGACE) 40 MG/ML suspension Take 5 mLs (200 mg total) by mouth daily.  240 mL  2  . omeprazole (PRILOSEC OTC)  20 MG tablet Take 20 mg by mouth daily.      . potassium chloride (K-DUR,KLOR-CON) 10 MEQ tablet Take 1 tablet (10 mEq total) by mouth daily.  30 tablet  6  . zolpidem (AMBIEN) 10 MG tablet Take 1 tablet by mouth at bedtime as needed for sleep.        No current facility-administered medications on file prior to visit.

## 2013-12-20 NOTE — Telephone Encounter (Signed)
Called spoke with Izora Gala, pt's spouse.  Discussed MR's recs as stated below.  Izora Gala stated that she does not feel that ER is necessary at this point but does feel comfortable with calling EMS should she feel that pt needs this.  Shawna Clamp to pick up the Levaquin rx sent earlier and to call the office if she is concerned and/or needs guidance.  Izora Gala verbalized her understanding and denied any further questions/concerns at this time.  Will sign off.

## 2013-12-20 NOTE — Telephone Encounter (Signed)
Called spoke with patient spouse Taylor Goodman.  Per Taylor Goodman pt's breathing is keeping him from doing any type of exertion/movement out of the bed.  She stated that OT had come to the home today and pt was not able to even stand up at bedside.  Taylor Goodman stated that pt feels an antibiotic is necessary.  Taylor Goodman believes pt is unable to come in for appt at this time, even with a wheelchair.  Pt's NPSG was cancelled for tomorrow 1.8.15.  Levaquin 500mg  sent to verified pharmacy [facility] [place].  Taylor Goodman did become emotional on the phone.  Advised that any movement will help patient -- try to help him sit on the side of the bed a few times a day, for meals, to wipe his face/hands etc.  Shawna Clamp to NOT get patient up on his feet without assistance d/t fall risk.  Asked Taylor Goodman to have pt's home health RN give our office a call when she next comes to home to provide her insight and recommendations on needed orders, if any.  Taylor Goodman okay with this and verbalized her understanding.  HFU moved up from 1.28.15 to 1.19.15 w/ TP Taylor Goodman did not believe that pt would be strong enough for sooner HFU and pt already has another appt scheduled for later that day).  Taylor Goodman to call for sooner follow up if needed, or to reschedule to a later date.  Will forward to MR as FYI about not needing to work patient in tomorrow morning.

## 2013-12-21 ENCOUNTER — Ambulatory Visit: Payer: Medicare Other | Admitting: Physician Assistant

## 2013-12-22 ENCOUNTER — Encounter: Payer: Self-pay | Admitting: Cardiovascular Disease

## 2013-12-27 ENCOUNTER — Ambulatory Visit (HOSPITAL_COMMUNITY): Admit: 2013-12-27 | Payer: Medicare Other

## 2013-12-27 ENCOUNTER — Ambulatory Visit (HOSPITAL_COMMUNITY): Payer: Medicare Other | Attending: Family Medicine

## 2014-01-01 ENCOUNTER — Ambulatory Visit: Payer: Medicare Other | Admitting: Physician Assistant

## 2014-01-01 ENCOUNTER — Inpatient Hospital Stay: Payer: Medicare Other | Admitting: Adult Health

## 2014-01-10 ENCOUNTER — Inpatient Hospital Stay: Payer: Medicare Other | Admitting: Internal Medicine

## 2014-01-14 DEATH — deceased

## 2014-11-22 ENCOUNTER — Encounter (HOSPITAL_COMMUNITY): Payer: Self-pay | Admitting: Internal Medicine

## 2015-09-12 IMAGING — CR DG CHEST 2V
2 series · 2 of 2 positions shown · non-contrast
Comparison: 12/07/2013

CLINICAL DATA: Emphysema, CHF, desaturation overnight

EXAM:
CHEST  2 VIEW

[w chest lat]
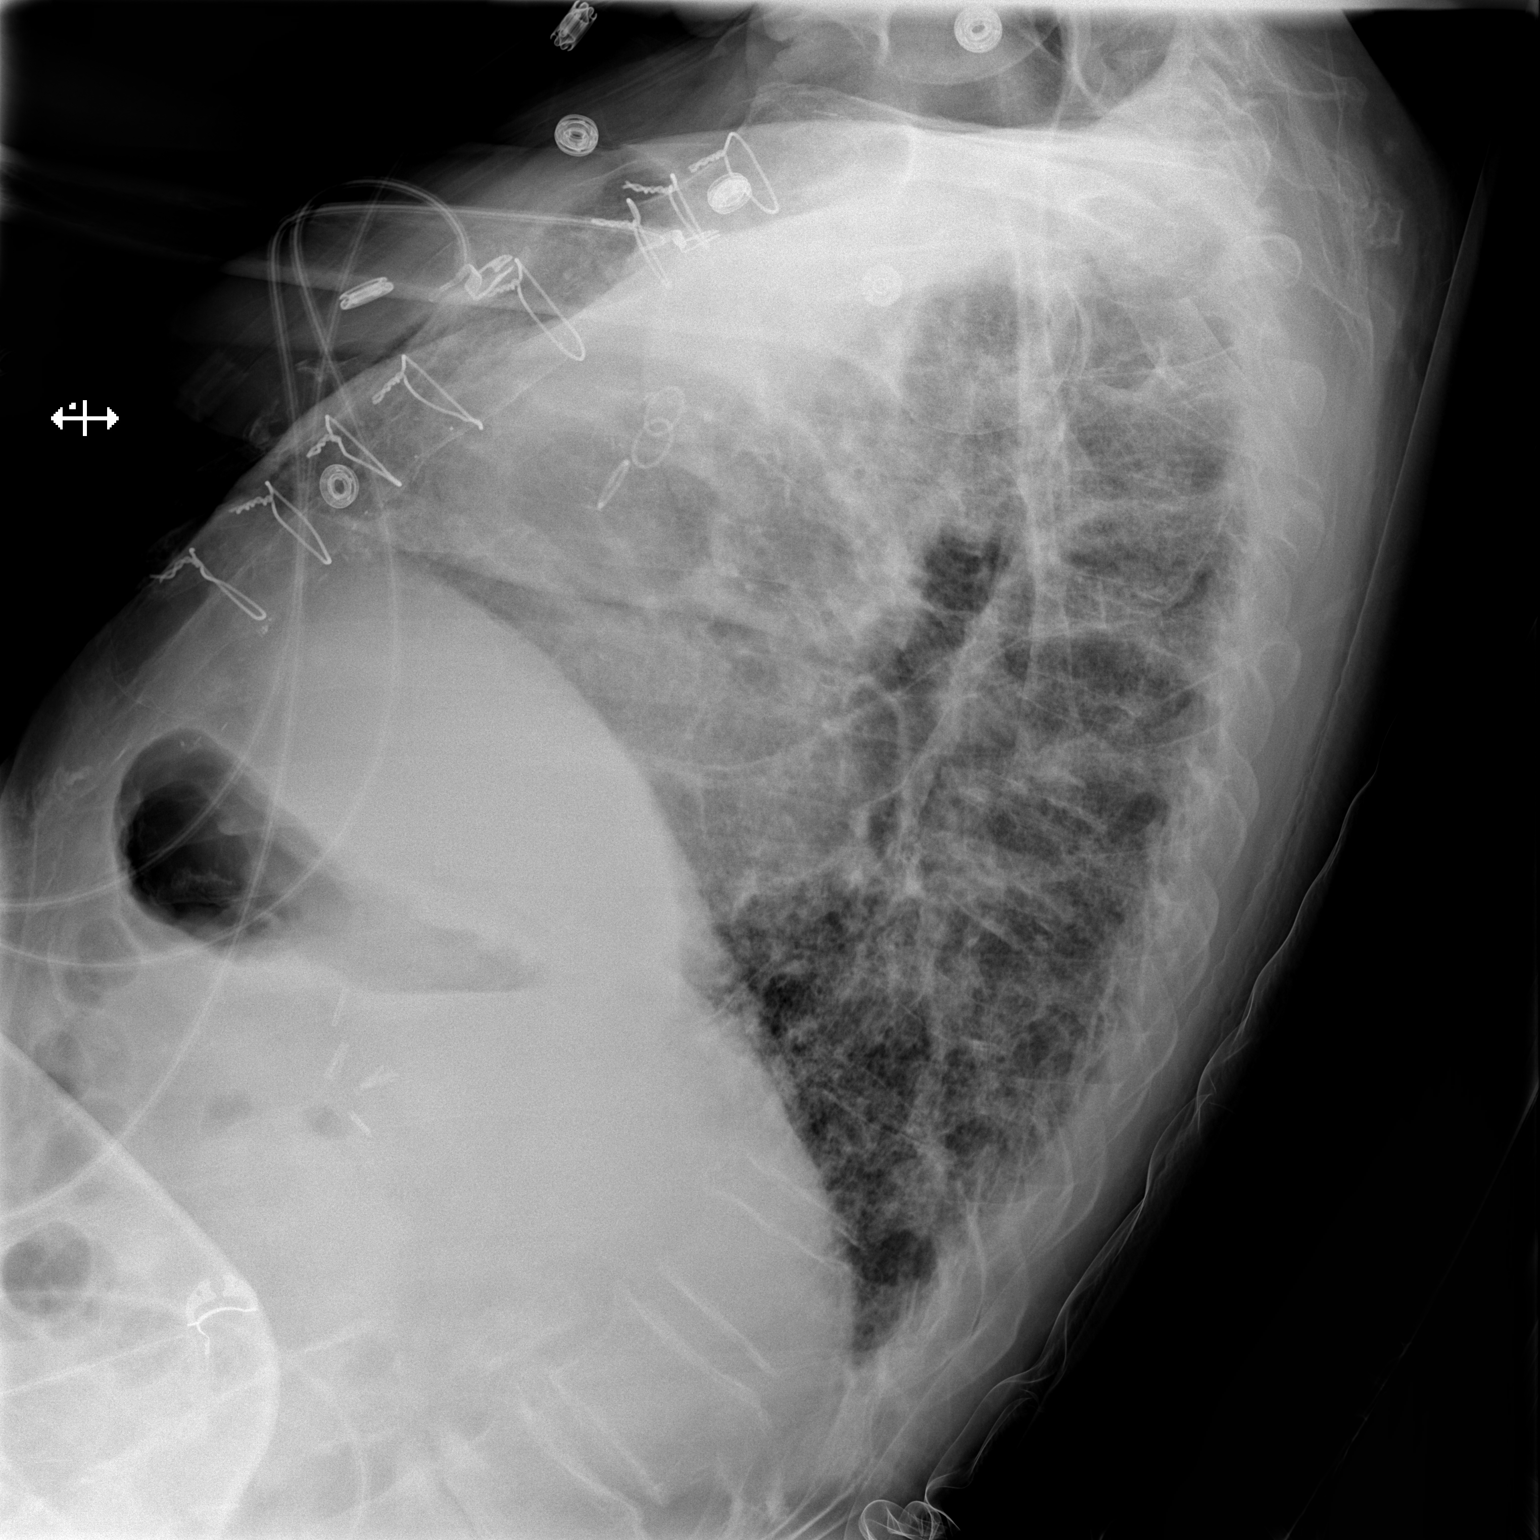

[x chest ap]
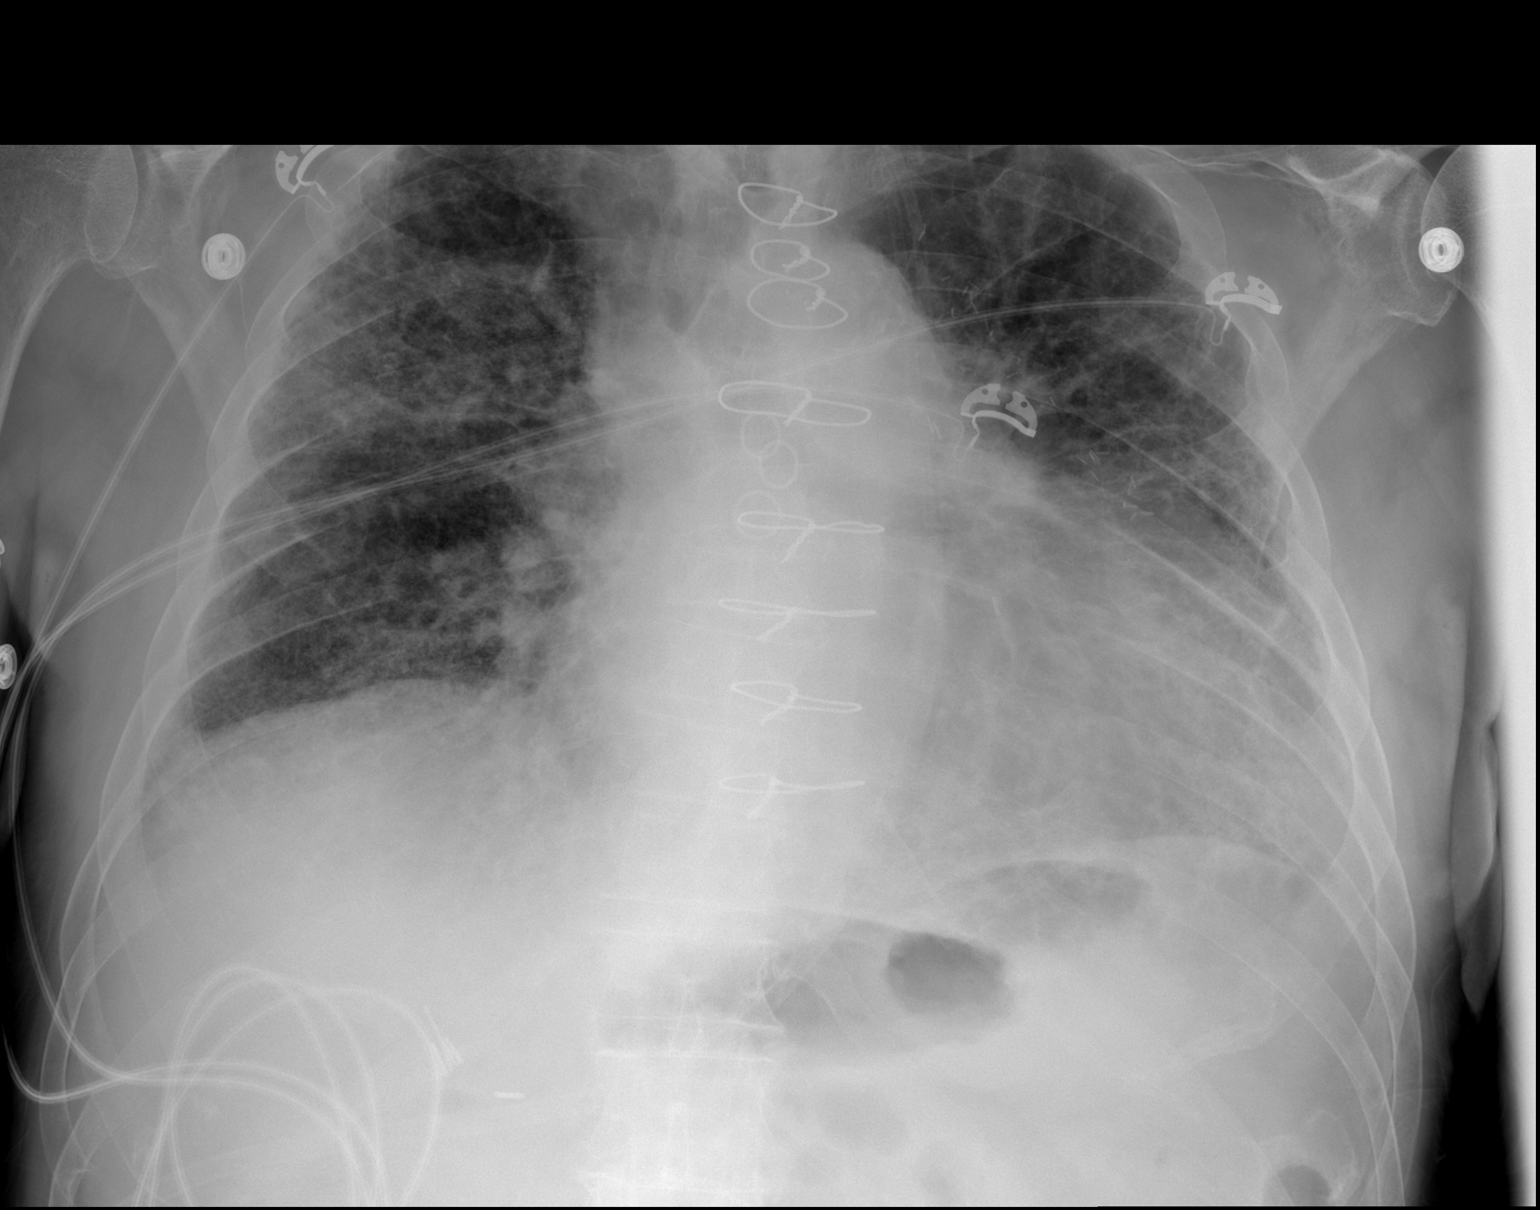

[2 of 2 positions shown; findings below may reference images not displayed]

FINDINGS: Low lung volumes. Cardiac silhouette is enlarged. There is diffuse
thickening of interstitial markings slightly less prominent when
compared to previous study. Areas peribronchial cuffing are
appreciated small is indistinctness of the pulmonary vasculature. No
focal regions of consolidation or focal infiltrates. Patient is
status post median sternotomy coronary artery bypass grafting. The
osseous structures are unremarkable.
IMPRESSION: Shallow inspiration.Improved interstitial infiltrate consistent with
improved pulmonary edema. Underlying component of pulmonary fibrosis
is also a diagnostic consideration. Atherosclerotic calcifications
are appreciated.
